# Patient Record
Sex: Female | Born: 1980 | Hispanic: Yes | Marital: Married | Smoking: Never smoker
Health system: Southern US, Community
[De-identification: ages and names within clinical notes are randomized; demographics above are authoritative.]

## PROBLEM LIST (undated history)

## (undated) ENCOUNTER — Inpatient Hospital Stay (HOSPITAL_COMMUNITY): Payer: Self-pay

## (undated) DIAGNOSIS — R51 Headache: Secondary | ICD-10-CM

## (undated) DIAGNOSIS — R519 Headache, unspecified: Secondary | ICD-10-CM

## (undated) HISTORY — PX: APPENDECTOMY: SHX54

---

## 2006-04-30 ENCOUNTER — Ambulatory Visit: Payer: Self-pay | Admitting: Nurse Practitioner

## 2006-05-01 ENCOUNTER — Ambulatory Visit: Payer: Self-pay | Admitting: *Deleted

## 2006-05-19 ENCOUNTER — Ambulatory Visit: Payer: Self-pay | Admitting: Nurse Practitioner

## 2006-12-30 ENCOUNTER — Emergency Department (HOSPITAL_COMMUNITY): Admission: EM | Admit: 2006-12-30 | Discharge: 2006-12-30 | Payer: Self-pay | Admitting: Emergency Medicine

## 2007-05-27 ENCOUNTER — Ambulatory Visit: Payer: Self-pay | Admitting: Family Medicine

## 2008-05-10 ENCOUNTER — Ambulatory Visit: Payer: Self-pay | Admitting: Internal Medicine

## 2008-05-10 ENCOUNTER — Encounter (INDEPENDENT_AMBULATORY_CARE_PROVIDER_SITE_OTHER): Payer: Self-pay | Admitting: Internal Medicine

## 2008-05-10 LAB — CONVERTED CEMR LAB
ALT: 12 units/L (ref 0–35)
AST: 17 units/L (ref 0–37)
Alkaline Phosphatase: 91 units/L (ref 39–117)
Basophils Absolute: 0 10*3/uL (ref 0.0–0.1)
Basophils Relative: 1 % (ref 0–1)
Creatinine, Ser: 0.64 mg/dL (ref 0.40–1.20)
Eosinophils Absolute: 0.1 10*3/uL (ref 0.0–0.7)
MCHC: 33.2 g/dL (ref 30.0–36.0)
MCV: 88.1 fL (ref 78.0–100.0)
Monocytes Relative: 9 % (ref 3–12)
Neutro Abs: 4.5 10*3/uL (ref 1.7–7.7)
Neutrophils Relative %: 66 % (ref 43–77)
Platelets: 271 10*3/uL (ref 150–400)
RBC: 4.55 M/uL (ref 3.87–5.11)
RDW: 12.7 % (ref 11.5–15.5)
Sodium: 137 meq/L (ref 135–145)
TSH: 3.301 microintl units/mL (ref 0.350–4.500)
Total Bilirubin: 0.4 mg/dL (ref 0.3–1.2)
Total Protein: 8.5 g/dL — ABNORMAL HIGH (ref 6.0–8.3)

## 2008-05-12 ENCOUNTER — Ambulatory Visit (HOSPITAL_COMMUNITY): Admission: RE | Admit: 2008-05-12 | Discharge: 2008-05-12 | Payer: Self-pay | Admitting: Internal Medicine

## 2008-06-01 ENCOUNTER — Encounter (INDEPENDENT_AMBULATORY_CARE_PROVIDER_SITE_OTHER): Payer: Self-pay | Admitting: Internal Medicine

## 2008-06-01 ENCOUNTER — Ambulatory Visit: Payer: Self-pay | Admitting: Internal Medicine

## 2008-06-01 LAB — CONVERTED CEMR LAB: Total Protein: 7.7 g/dL (ref 6.0–8.3)

## 2008-08-01 ENCOUNTER — Ambulatory Visit: Payer: Self-pay | Admitting: Internal Medicine

## 2008-09-05 ENCOUNTER — Ambulatory Visit: Payer: Self-pay | Admitting: Internal Medicine

## 2008-10-11 ENCOUNTER — Ambulatory Visit: Payer: Self-pay | Admitting: Internal Medicine

## 2008-10-11 ENCOUNTER — Telehealth (INDEPENDENT_AMBULATORY_CARE_PROVIDER_SITE_OTHER): Payer: Self-pay | Admitting: *Deleted

## 2009-11-14 ENCOUNTER — Ambulatory Visit: Payer: Self-pay | Admitting: Family Medicine

## 2009-11-14 ENCOUNTER — Encounter: Payer: Self-pay | Admitting: Family Medicine

## 2009-11-14 LAB — CONVERTED CEMR LAB
ABO/RH(D): O POS
Antibody Screen: NEGATIVE
Eosinophils Absolute: 0.2 10*3/uL (ref 0.0–0.7)
Eosinophils Relative: 2 % (ref 0–5)
HCT: 35.1 % — ABNORMAL LOW (ref 36.0–46.0)
Hepatitis B Surface Ag: NEGATIVE
Lymphocytes Relative: 25 % (ref 12–46)
Lymphs Abs: 1.9 10*3/uL (ref 0.7–4.0)
MCV: 87.1 fL (ref 78.0–100.0)
Monocytes Relative: 6 % (ref 3–12)
Neutrophils Relative %: 67 % (ref 43–77)
Platelets: 252 10*3/uL (ref 150–400)
RBC: 4.03 M/uL (ref 3.87–5.11)
Rh Type: POSITIVE
WBC: 7.7 10*3/uL (ref 4.0–10.5)

## 2009-11-21 ENCOUNTER — Ambulatory Visit: Payer: Self-pay | Admitting: Family Medicine

## 2009-11-21 ENCOUNTER — Encounter: Payer: Self-pay | Admitting: Family Medicine

## 2009-11-21 DIAGNOSIS — N898 Other specified noninflammatory disorders of vagina: Secondary | ICD-10-CM | POA: Insufficient documentation

## 2009-11-21 LAB — CONVERTED CEMR LAB
GC Probe Amp, Genital: NEGATIVE
Pap Smear: NEGATIVE

## 2009-11-23 ENCOUNTER — Encounter: Payer: Self-pay | Admitting: Family Medicine

## 2009-11-24 ENCOUNTER — Encounter: Payer: Self-pay | Admitting: Family Medicine

## 2009-11-24 ENCOUNTER — Ambulatory Visit (HOSPITAL_COMMUNITY): Admission: RE | Admit: 2009-11-24 | Discharge: 2009-11-24 | Payer: Self-pay | Admitting: Family Medicine

## 2009-12-18 ENCOUNTER — Ambulatory Visit: Payer: Self-pay | Admitting: Family Medicine

## 2009-12-29 ENCOUNTER — Encounter: Payer: Self-pay | Admitting: Family Medicine

## 2010-01-18 ENCOUNTER — Ambulatory Visit: Payer: Self-pay | Admitting: Family Medicine

## 2010-01-19 ENCOUNTER — Ambulatory Visit: Payer: Self-pay | Admitting: Family Medicine

## 2010-01-19 ENCOUNTER — Encounter: Payer: Self-pay | Admitting: Family Medicine

## 2010-02-16 ENCOUNTER — Ambulatory Visit: Payer: Self-pay | Admitting: Family Medicine

## 2010-02-16 ENCOUNTER — Encounter: Payer: Self-pay | Admitting: Family Medicine

## 2010-02-16 LAB — CONVERTED CEMR LAB: Whiff Test: NEGATIVE

## 2010-03-12 ENCOUNTER — Encounter: Payer: Self-pay | Admitting: Family Medicine

## 2010-03-12 ENCOUNTER — Ambulatory Visit: Admission: RE | Admit: 2010-03-12 | Discharge: 2010-03-12 | Payer: Self-pay | Source: Home / Self Care

## 2010-03-12 LAB — CONVERTED CEMR LAB
HIV: NONREACTIVE
Platelets: 232 10*3/uL (ref 150–400)
RBC: 3.67 M/uL — ABNORMAL LOW (ref 3.87–5.11)
WBC: 8.1 10*3/uL (ref 4.0–10.5)

## 2010-03-14 ENCOUNTER — Ambulatory Visit: Admission: RE | Admit: 2010-03-14 | Discharge: 2010-03-14 | Payer: Self-pay | Source: Home / Self Care

## 2010-03-14 ENCOUNTER — Encounter: Payer: Self-pay | Admitting: Family Medicine

## 2010-03-14 DIAGNOSIS — O99810 Abnormal glucose complicating pregnancy: Secondary | ICD-10-CM | POA: Insufficient documentation

## 2010-03-20 NOTE — Assessment & Plan Note (Signed)
Summary: OB/KH   Vital Signs:  Patient profile:   30 year old female Weight:      135 pounds Pulse rate:   72 / minute BP sitting:   100 / 64  (left arm) Cuff size:   regular  Vitals Entered By: Tessie Fass CMA (January 18, 2010 10:14 AM) CC: OB Visit   Primary Care Camilah Spillman:  Lloyd Huger MD  CC:  OB Visit.  History of Present Illness: Visit conducted in [place].  She is G2P1, prior NSVD at term.  Feels daily fetal movement.  No vaginal bleeding or discharge.    Reports that her mother has DM, adult onset.  Patient denies history of GDM with first pregnancy; first baby between 6-7lbs birth weight.  Initial labs and UCx reviewed today with patient.  Dating by initial dating [place] at [facility].    Habits & Providers  Alcohol-Tobacco-Diet     Cigarette Packs/Day: n/a   Impression & Recommendations:  Problem # 1:  PREGNANT STATE, INCIDENTAL (ICD-V22.2) Patient here for routine prenatal visit.  Of note, patient's mother with DM.  Patient meets criteria for early glucola testing (ethnicity, 1st degree relative with DM).  1hrGTT done today failed.  Is to return tomorrow for 3hGTT.  Discussed plans to breast feed.  Patient growth and weight discussed.  If 3hGTT passes, then to follow up here in 4 weeks.  Dating by initial [place] establishing EDD 06/01/2010. Orders: Glucose 1 hr-[facility] (82950) Other OB visit- [facility] (OBCK)  Complete Medication List: 1)  Prenatal Vitamins 0.8 Mg Tabs (Prenatal multivit-min-fe-fa)  Patient Instructions: 1)  Fue un placer verle hoy.  Tiene 20 semanas con 6 dias de embarazo. 2)  Siga tomando las vitaminas antenatales. 3)  Estamos chequeando para diabetes gestacional.   4)  OB followup in 4 weeks with Dr Cristal Ford or other physician in AM slot Mauricio Po is fine)   Orders Added: 1)  Glucose 1 hr-[facility] [[id number]] 2)  Other OB visit- [facility] [OBCK]     Flowsheet View for Follow-up Visit    Estimated weeks of       gestation:     20 6/7    Weight:     135    Blood  pressure:   100 / 64    Hx headache?     No    Nausea/vomiting?   No    Edema?     0    Bleeding?     no    Leakage/discharge?   no    Fetal activity:       yes    Labor symptoms?   no    Fundal height:      umbilicus    FHR:       140    Fetal position:      N/A    Taking Vitamins?   Y    Smoking PPD:   n/a    Comment:     early 1hrGTT    Next visit:     4 wk    Preceptor:     Mauricio Po

## 2010-03-20 NOTE — Assessment & Plan Note (Signed)
Summary: New OB visit   Vital Signs:  Patient profile:   30 year old female LMP:     08/06/2009 Weight:      129.8 pounds BP sitting:   100 / 68  Vitals Entered By: Arlyss Repress CMA, (November 21, 2009 1:51 PM)  Menstrual History Menses Interval (days):  variable Menstrual Flow (days):  4  Primary Provider:  Lloyd Huger MD   History of Present Illness: Patient had IUD taken out in 06/2009 and had menstrual period of regular quality on 08/06/2009. Prior to this, periods were irregular and sometimes occurring once every 2 months. Pregnancy was planned and desired, she is excited about second child.   HAs have been occurring about 2x/day after eating meals. She feels tired and lies down. Denies photophobia, vision changes. These are mild and she has not taken any medications.   Has had nausea for past 2 months that is mild. Only 1x emesis.   Past History:  Past Surgical History: Appendectomy in [place] at age 26  Social History: Occupation:  restaurant  Packs/Day:  n/a  Review of Systems       The patient complains of peripheral edema and headaches.  The patient denies anorexia, fever, vision loss, syncope, dyspnea on exertion, melena, incontinence, genital sores, difficulty walking, and depression.    Physical Exam  General:  Well-developed,well-nourished,in no acute distress; alert,appropriate and cooperative throughout examination Head:  Normocephalic and atraumatic without obvious abnormalities. No apparent alopecia or balding. Eyes:  PERRLA, EOMI Mouth:  Oral mucosa and oropharynx without lesions or exudates.  Teeth in good repair. Lungs:  Normal respiratory effort, chest expands symmetrically. Lungs are clear to auscultation, no crackles or wheezes. Heart:  Normal rate and regular rhythm. S1 and S2 normal without gallop, murmur, click, rub or other extra sounds. Abdomen:  Bowel sounds positive,abdomen soft and non-tender without masses, organomegaly or hernias noted.  Surgical scar on right periumbilicus. Genitalia:  Normal introitus for age, no external lesions,  mucosa pink and moist, no vaginal or cervical lesions, no vaginal atrophy, no friaility or hemorrhage. Moderate amount white discharge.  Msk:  No deformity or scoliosis noted of thoracic or lumbar spine.   Extremities:  No clubbing, cyanosis, edema, or deformity noted with normal full range of motion of all joints.   Neurologic:  No cranial nerve deficits noted. Station and gait are normal. Sensory, motor and coordinative functions appear intact. Skin:  Intact without suspicious lesions or rashes Psych:  Cognition and judgment appear intact. Alert and cooperative with normal attention span and concentration. No apparent delusions, illusions, hallucinations   Impression & Recommendations:  Problem # 1:  PREGNANT STATE, INCIDENTAL (ICD-V22.2) Appears to be progressing without complication. Will obtain dating ultrasound with history of periods at irregular intervals.  Orders: GC/Chlamydia-[facility] (87591/[id number]) Pap Smear-[facility] ([id number]) [facility] Prenatal U/S > 14 weeks - 91478 (Prenatal U/S) Other OB visit- [facility] (OBCK)  Problem # 2:  LEUKORRHEA NOT SPECIFIED AS INFECTIVE (ICD-623.5) Pt did not complain of vaginal discharge; however, discharge was noted on exam and wet mount performed. No trichamonads, yeast, or clue cells detected. Will f/u with GC/Chl tests.   Complete Medication List: 1)  Prenatal Vitamins 0.8 Mg Tabs (Prenatal multivit-min-fe-fa)  Patient Instructions: 1)  Please schedule an OB appointment in one month with Dr. Mauricio Po or Dr. [place]. 2)  You will be contacted about the ultrasound to show the age of the baby.  3)  I will call you if your test results  are not normal. 4)  Continue taking prenatal vitamins. 5)  You may take tylenol (acetaminophen) for headaches if you desire.    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  Menstrual History    LMP (date): 08/06/2009    EDC by LMP: 05/13/2010    Best Working EDC: 05/13/2010    LMP - Character: normal    LMP - Reliable? : Yes    Menses interval: variable days    Menstrual flow 4 days    On BCP's at conception: no    Date of positive (+) home preg. test: 10/13/2009    Pre Pregnancy Weight: 125 lbs.    Symptoms since LMP: amenorrhea, nausea, vomiting    Other symptoms: low back pain, muscular soreness   Flowsheet View for Follow-up Visit    Estimated weeks of       gestation:     15 2/7    Weight:     129.8    Blood pressure:   100 / 68    Headache:     daily    Nausea/vomiting:   nausea    Edema:     TrLE    Vaginal bleeding:   no    Vaginal discharge:   no    FHR:       140    Fetal activity:     yes    Labor symptoms:   no    Taking prenatal vits?   Y    Smoking:     n/a    Next visit:     4 wk  Prenatal Visit    FOB name: Herby Abraham [facility] Confirmation:    New working [facility]: 05/13/2010    LMP reliable? Yes    Last menses onset (LMP) date: 08/06/2009    EDC by LMP: 05/13/2010   Past Pregnancy History    Gravida:     2    Term Births:     1    Premature Births:   0    Living Children:   1    Para:       1    Mult. Births:     0    Prev C-Section:   0    Aborta:     0    Elect. Ab:     0    Spont. Ab:     0  Pregnancy # 1    Delivery date:     01/04/2003    Weeks Gestation:   40    Preterm labor:     no    Delivery type:     NSVD    Infant Sex:     Female    Birth weight:     6lb 13oz.    Name:     [place]   Genetic History    Father of baby:   Herby Abraham    FOB Family Hx:     Unknown     Thalassemia:     mother: no    Neural tube defect:   mother: no    Down's Syndrome:  mother:  no    Tay-Sachs:     mother: no    Sickle Cell Dz/Trait:   mother: no    Hemophilia:     mother: no    Muscular Dystrophy:   mother: no    Cystic Fibrosis:   mother: no    Huntington's Dz:   mother: no    Mental Retardation:   mother: no    Fragile X:     mother: no    Other Genetic or       Chromosomal Dz:   mother: no    Child with other       birth defect:     mother: no    > 3 spont. abortions:   mother: no    Hx of stillbirth:     mother: no   Laboratory Results  Date/Time Received: November 21, 2009 2:45 PM  Date/Time Reported: November 21, 2009 3:47 PM   [employer] Source: vag WBC/hpf: 10-20 Bacteria/hpf: 3+  Rods Clue cells/hpf: none  Negative whiff Yeast/hpf: none Trichomonas/hpf: none Comments: many sperm ...............test performed by......Marland KitchenBonnie A. 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  Menstrual History    LMP (date): 08/06/2009    EDC by LMP: 05/13/2010    Best Working EDC: 05/13/2010    LMP - Character: normal    LMP - Reliable? : Yes    Menses interval: variable days    Menstrual flow 4 days    On BCP's at conception: no    Date of positive (+) home preg. test: 10/13/2009    Pre Pregnancy Weight: 125 lbs.    Symptoms since LMP: amenorrhea, nausea, vomiting    Other symptoms: low back pain, muscular soreness

## 2010-03-20 NOTE — Assessment & Plan Note (Signed)
Summary: ob visit,tcb   Vital Signs:  Patient profile:   30 year old female Weight:      130 pounds Pulse rate:   82 / minute BP sitting:   109 / 66  Vitals Entered By: Garen Grams LPN (December 18, 2009 3:05 PM)  Primary Kathaleya Mcduffee:  Lloyd Huger MD   History of Present Illness: 30 yo hispanic female G2P1 presents at 16.3 weeks by dating ultrasound (this is 3 weeks earlier than previous LMP dating). Uncomplicated thus far. Complaints include mild constipation, low back pain after long periods of standing or walking, and recent cold-like symptoms. She also had HAs which have resolved. Drinking ginger tea and cinnamon tea for cold. Taking tums for heartburn. Not gaining much weight due to decreased appetite. Denies n/v, just can't find food that is appetizing.   Past History:  Past Surgical History: Last updated: 11/21/2009 Appendectomy in [place] at age 30  Risk Factors: Packs/Day: n/a (12/18/2009)  Review of Systems  The patient denies fever, weight loss, weight gain, chest pain, syncope, dyspnea on exertion, peripheral edema, prolonged cough, headaches, and abdominal pain.    Physical Exam  General:  Well-developed,well-nourished,in no acute distress; alert,appropriate and cooperative throughout examination Head:  NCAT Eyes:  PERRLA, EOMI Mouth:  Oral mucosa and oropharynx without lesions or exudates.  Teeth in good repair. Lungs:  Normal respiratory effort, chest expands symmetrically. Lungs are clear to auscultation, no crackles or wheezes. Heart:  Normal rate and regular rhythm. S1 and S2 normal without gallop, murmur, click, rub or other extra sounds. Abdomen:  Gravid. Soft, mild tenderness over pubic tubercle. BS+ Msk:  No deformity or scoliosis noted of thoracic or lumbar spine.   Extremities:  No clubbing, cyanosis, edema, or deformity noted with normal full range of motion of all joints.   Neurologic:  No cranial nerve deficits noted. Station and gait are normal.   Sensory, motor and coordinative functions appear intact. Skin:  Intact without suspicious lesions or rashes Psych:  Cognition and judgment appear intact. Alert and cooperative with normal attention span and concentration. No apparent delusions, illusions, hallucinations   Impression & Recommendations:  Problem # 1:  PREGNANT STATE, INCIDENTAL (ICD-V22.2) Assessment Unchanged Normal progression at 16.3 by dating ultrasound on 10-7, periods were unreliable and would be 19 weeks per LMP. Counseled on weight gain and recommended she try small meals throughout the day even if not hungry. Scheduled anatomy screen at 18 weeks with Dr. Elsie Stain office. Will f/u in OB clinic in 4 weeks.   Orders: Prenatal U/S > 14 weeks - [address] (Prenatal U/S) Other OB visit- [facility] (OBCK)  Complete Medication List: 1)  Prenatal Vitamins 0.8 Mg Tabs (Prenatal multivit-min-fe-fa)  Patient Instructions: 1)  Try to eat frequent small meals throughout the day for proper weight gain. 2)  You may drink tea for your cold symptoms. 3)  You may use miralax (polyethylene glycol) for constipation.  4)  Please schedule OB visit in 4 weeks with Dr. Mauricio Po or Dr. [place].   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  Menstrual History    LMP (date): 08/06/2009    Best Working EDC: 06/01/2010    LMP - Character: normal    Menses interval: variable days    Menstrual flow 4 days    On BCP's at conception: no    Date of positive (+) home preg. test: 10/13/2009   Flowsheet View for Follow-up Visit    Estimated weeks of       gestation:      16 3/7    Weight:     130    Blood pressure:   109 / 66    Headache:     No    Nausea/vomiting:   No    Edema:     0    Vaginal bleeding:   no    Vaginal discharge:   no    FHR:       150    Fetal activity:     yes    Labor symptoms:   no    Fetal position:     N/A    Taking prenatal vits?   Y    Smoking:     n/a    Next visit:     4 wk  Prenatal Visit EDC Confirmation:    New working [facility]: 06/01/2010 Ultrasound Dating Information:    First U/S on 11/24/2009   Gest age: 46.0   EDC: 06/01/2010.   Appended Document: ob visit,tcb ultrasound received from dr. Elsie Stain ofc and placed in dr. Sherran Needs box

## 2010-03-20 NOTE — Letter (Signed)
Summary: Generic Letter  [facility]  [address]   [place], [place] [address]   Phone: [phone]  Fax: [phone]    11/23/2009  Brittany Mejia [address] [place], [place]  [address]  Dear [name],  Your recent lab tests and pap smear were normal. There were no irregular cells detected. It is recommended that you continue to be screened on a yearly basis.   Sincerely,   Lloyd Huger MD  Appended Document: Generic Letter mailed.  Appended Document: Generic Letter mailed.

## 2010-03-22 NOTE — Assessment & Plan Note (Signed)
Summary: OB/KH   KONKOL NOT AVAIL IN AM   Vital Signs:  Patient profile:   30 year old female Weight:      136 pounds Temp:     97.6 degrees F oral Pulse rate:   77 / minute BP sitting:   97 / 60  (left arm) Cuff size:   regular  Vitals Entered By: Loralee Pacas CMA (February 16, 2010 9:36 AM) CC: ob   Primary Care Provider:  Lloyd Huger MD  CC:  ob.  History of Present Illness: 1.  Discharge:  Patient complaining of thin white discharge that has increasd over past 3 days.  Also with burning and itching.  No new sexual contacts.  Denies thick, curd like discharge.  No odor to discharge.  Otherwise see OB FLowsheet and CPOE  2.  Question of contractions:  Patient feels she may have had 1 or 2 contractions over past 2 days.  Feel tightening in her pelvis like menstrual cramps.  Has been more active with walking and family visits/shopping for past several days.  Not drinking much water by report.    Habits & Providers  Alcohol-Tobacco-Diet     Cigarette Packs/Day: n/a  Current Problems (verified): 1)  Leukorrhea Not Specified As Infective  (ICD-623.5) 2)  Pregnant State, Incidental  (ICD-V22.2)  Current Medications (verified): 1)  Prenatal Vitamins 0.8 Mg Tabs (Prenatal Multivit-Min-Fe-Fa)  Allergies (verified): No Known Drug Allergies  Review of Systems       no headaches, vision changes, chest pain, dyspnea, nausea/vomiting, changes in bowel habits, lower extremity edema   Physical Exam  General:  Vital signs reviewed. Well-developed, well-nourished patient in NAD.  Awake and cooperative  Abdomen:  gravid, fundal height and FHTs appropriate for gestational age, bowel sounds present in all four quadrants      Impression & Recommendations:  Problem # 1:  PREGNANT STATE, INCIDENTAL (ICD-V22.2) Assessment Unchanged Patient doing well.  Fundal heights and FHTs appropriate.  Prenatal screen reviewed.  Passed 3 hour glucose. Patient with some questionable  contractions, due to these symptoms and increasd discharge checked ferning slide.   Increased activity and decreasd oral fluid intake for past several days.  These likely the cause of questionable contractions.   Gave strict red flags and reasons to return to clinic orally and written. To fu in 4 weeks for routine labor check.   Orders: Miscellaneous Lab Charge-[facility] (74259) Other OB visit- [facility] (OBCK)  Problem # 2:  LEUKORRHEA NOT SPECIFIED AS INFECTIVE (ICD-623.5) Most likely normal increased discharge with pregnancy.  Ferning negative.  Wet prep negative.  Reassured patient.   [phone])  Complete Medication List: 1)  Prenatal Vitamins 0.8 Mg Tabs (Prenatal multivit-min-fe-fa)  Patient Instructions: 1)  Make an appt to follow up in 4 with with Dr. Cristal Ford, Dr. Mauricio Po, or myself.  2)  If you have any vaginal bleeding, increased fluid, or increasing contractions, call or go to MAU.     Orders Added: 1)  Miscellaneous Lab Charge-[facility] [99999] 2)  Wet Prep- [facility] [87210] 3)  Other OB visit- [facility] [OBCK]      Flowsheet View for Follow-up Visit    Estimated weeks of       gestation:     25 0/7    Weight:     136    Blood pressure:   97 / 60    Headache:     No    Nausea/vomiting:   No    Edema:  0    Vaginal bleeding:   no    Vaginal discharge:   no    Fundal height:      25    FHR:       130s    Fetal activity:     yes    Labor symptoms:   no    Fetal position:     N/A    Taking prenatal vits?   Y    Smoking:     n/a    Next visit:     4 wk    Resident:     Gwendolyn Grant  Laboratory Results  Date/Time Received: February 16, 2010 10:36 AM  Date/Time Reported: February 16, 2010 11:17 AM   Wet Mount Source: vag WBC/hpf: 5-10 Bacteria/hpf: 3+  Rods Clue cells/hpf: none  Negative whiff Yeast/hpf: none Trichomonas/hpf: none  Other Tests  Ferning: absent Comments: sperm present ...............test performed by......Marland KitchenBonnie A. Sw[place]il[place], MLS  (ASCP)cm      Flowsheet View for Follow-up Visit    Estimated weeks of       gestation:     25 0/7    Weight:     136    Blood pressure:   97 / 60    Hx headache?     No    Nausea/vomiting?   No    Edema?     0    Bleeding?     no    Leakage/discharge?   no    Fetal activity:       yes    Labor symptoms?   no    Fundal height:      25    FHR:       130s    Fetal position:      N/A    Taking Vitamins?   Y    Smoking PPD:   n/a    Next visit:     4 wk    Resident:     Gwendolyn Grant

## 2010-03-22 NOTE — Assessment & Plan Note (Signed)
Summary: 1 MOS PER DR Thuan Tippett,PT WILL BE 26WKS/RH   Vital Signs:  Patient profile:   30 year old female Height:      61 inches Weight:      140 pounds Temp:     98.3 degrees F oral Pulse rate:   77 / minute Pulse rhythm:   regular BP sitting:   105 / 60  (right arm) Cuff size:   regular  Vitals Entered By: Loralee Pacas CMA (March 12, 2010 10:05 AM) CC: ob Is Patient Diabetic? No   CC:  ob.  Habits & Providers  Alcohol-Tobacco-Diet     Cigarette Packs/Day: n/a  Exercise-Depression-Behavior     Have you felt down or hopeless? no     Have you felt little pleasure in things? no     Depression Counseling: not indicated; screening negative for depression     Seat Belt Use: always  Current Medications (verified): 1)  Prenatal Vitamins 0.8 Mg Tabs (Prenatal Multivit-Min-Fe-Fa)  Allergies (verified): No Known Drug Allergies  Social History: Risk analyst Use:  always  Review of Systems       no headaches, vision changes, chest pain, dyspnea, nausea/vomiting, changes in bowel habits, lower extremity edema   Physical Exam  General:  Vital signs reviewed. Well-developed, well-nourished patient in NAD.  Awake and cooperative  Abdomen:  gravid, fundal height and FHTs appropriate for gestational age, bowel sounds present in all four quadrants  Extremities:  No clubbing, cyanosis, edema, or deformity noted with normal full range of motion of all joints.  No LE edema noted   Impression & Recommendations:  Problem # 1:  PREGNANT STATE, INCIDENTAL (ICD-V22.2) 28 wk G2P1001 here for FU OB visit.   Patient doing well, without complaints.   Prenatal labs reviewed, passed 3 hour Glucola earlier. 28 week labs obtained today.  Discussed need for repeat Glucola with patient as she is now 28 weeks.  She states she would rather just do the 3 hour test again since she failed the first 1 hour Glucola.  Does not have time to obtain today, will make lab appt for this week.     Patient is  a patient of Dr. Ernest Haber, however Dr. Cristal Ford is currently on inpatient rotation and unavailable for AM clinic times.  Patient prefers AM clinic times due to having young children home from school in PM.   FU in 2 weeks with either myself or Dr. Cristal Ford.  Also discussed making appt in OB clinic today.   Orders: CBC-[facility] ([address]) RPR-[facility] [phone]) HIV-[facility] ([id number]) Other OB visit- [facility] (OBCK)  Complete Medication List: 1)  Prenatal Vitamins 0.8 Mg Tabs (Prenatal multivit-min-fe-fa)  Patient Instructions: 1)  Schedule for a lab appt for Glucola test in the next few days.  2)  Schedule for the next OB clinic.    3)  Come back in 2 weeks to see either me or Dr. Cristal Ford.   4)  We will check your blood today and let you know the results.   5)  If your having any bad pain, vaginal bleeding, not feeling the baby move call the clinic or go to the MAU immediately.   6)  Good to see you again today!   Orders Added: 1)  CBC-[facility] [85027] 2)  RPR-[facility] [[id number]] 3)  HIV-[facility] [[id number]] 4)  Other OB visit- [facility] [OBCK]      Flowsheet View for Follow-up Visit    Estimated weeks of       gestation:  28 3/7    Weight:     140    Blood pressure:   105 / 60    Headache:     No    Nausea/vomiting:   No    Edema:     0    Vaginal bleeding:   no    Vaginal discharge:   no    Fundal height:      28    Fetal activity:     yes    Labor symptoms:   no    Fetal position:     N/A    Taking prenatal vits?   Y    Smoking:     n/a    Next visit:     2 wk    Resident:     Mendel [place] for Follow-up Visit    Estimated weeks of       gestation:     28 3/7    Weight:     140    Blood pressure:   105 / 60    Hx headache?     No    Nausea/vomiting?   No    Edema?     0    Bleeding?     no    Leakage/discharge?   no    Fetal activity:       yes    Labor symptoms?   no    Fundal height:      28    Fetal position:      N/A    Taking Vitamins?   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  Menstrual History    LMP (date): 08/06/2009    LMP - Character: normal    Menses interval: variable days    Menstrual flow 4 days    On BCP's at conception: no    Date of positive (+) home preg. test: 10/13/2009

## 2010-03-22 NOTE — Letter (Signed)
Summary: Handout Printed  Printed Handout:  - Pityriasis Rosea  Appended Document: Handout Printed Printed by accident - this was actually a handout for another patient.   This patient does not have Pityriasis.

## 2010-03-28 ENCOUNTER — Ambulatory Visit (INDEPENDENT_AMBULATORY_CARE_PROVIDER_SITE_OTHER): Payer: Self-pay | Admitting: Family Medicine

## 2010-03-28 ENCOUNTER — Encounter: Payer: Self-pay | Admitting: Family Medicine

## 2010-03-28 VITALS — BP 98/60 | Wt 142.0 lb

## 2010-03-28 DIAGNOSIS — Z3480 Encounter for supervision of other normal pregnancy, unspecified trimester: Secondary | ICD-10-CM

## 2010-03-28 DIAGNOSIS — Z3490 Encounter for supervision of normal pregnancy, unspecified, unspecified trimester: Secondary | ICD-10-CM | POA: Insufficient documentation

## 2010-03-28 NOTE — Patient Instructions (Addendum)
Psychiatrist Glass blower/designer trimestre) (Pregnancy - Third Trimester)   El tercer trimestre del embarazo (los ltimos 3 meses) es el perodo de cambios ms rpidos que atraviesan usted y el beb. El aumento de peso es ms rpido. El beb alcanza un largo de aproximadamente 50 cm (20 pulgadas) y pesa entre 2,700 y 4,500 kg (6 a 10 libras). El beb gana ms tejido graso y ya est listo para la vida fuera del cuerpo de la [place]. Mientras estn en el interior, los bebs tienen perodos de sueo y vigilia, Warehouse manager y tienen hipo. Quizs sienta pequeas contracciones del tero. Este es el falso trabajo de [place]. Tambin se las conoce como contracciones de [place]. Es como una prctica del parto. Los problemas ms habituales de esta etapa del embarazo incluyen mayor dificultad para respirar, hinchazn de las manos y los pies por retencin de lquidos y la necesidad de Geographical information systems officer con ms frecuencia debido a que el tero y el beb presionan sobre la vejiga.    EXAMENES PRENATALES  Durante los [employer]power [employer], deber seguir realizando pruebas de [place], segn avance el [place]. Estas pruebas se realizan para controlar su salud y la del beb. Tambin se realizan anlisis de sangre para [employer] niveles de [place]. La anemia (bajo nivel de hemoglobina) es frecuente durante el embarazo. Para prevenirla, se administran hierro y vitaminas. Tambin le harn nuevas pruebas para descartar la diabetes. Podrn repetirle algunas de las [employer] hicieron previamente.  En cada visita le medirn el tamao del tero. Es para asegurarse de que el beb se desarrolla correctamente.   Tambin en cada visita la pesarn. Esto se realiza para asegurarse de que aumenta de peso al ritmo indicado y que usted y su beb evolucionan normalmente.  En algunas ocasiones se realiza una ecografa para confirmar el correcto desarrollo y evolucin del beb. Esta prueba se realiza con ondas sonoras inofensivas para el beb, de  modo que el profesional pueda calcular con ms precisin la fecha del [place].  Discuta las posibilidades de la anestesia si necesita cesrea.   Algunas veces se realizan pruebas especializadas del lquido amnitico que rodea al beb. Esta prueba se denomina amniocentesis. El lquido amnitico se obtiene introduciendo una aguja en el abdomen (vientre). En ocasiones se lleva a cabo cerca del final del embarazo, si es Optician, dispensing. En este caso se realiza para asegurarse de que los pulmones del beb estn lo suficientemente maduros como para que pueda vivir fuera del tero.   CAMBIOS QUE OCURREN EN EL TERCER TRIMESTRE DEL EMBARAZO Su organismo atravesar diferentes cambios durante el embarazo que varan de Neomia Dear persona a Educational psychologist. Converse con el profesional que la asiste acerca los cambios que usted note y que la preocupen.  Durante el ltimo trimestre probablemente sienta un aumento del apetito. Es normal tener "antojos" de Development worker, community. Esto vara de Neomia Dear persona a otra y de un embarazo a Therapist, art.   Podrn aparecer las primeras estras en las caderas, abdomen y [place]. Estos son cambios normales del cuerpo durante el [place]. No existen medicamentos ni ejercicios que puedan prevenir CarMax.  El estreimiento puede tratarse con un laxante o agregando fibra a su dieta. Beber grandes cantidades de lquidos, tomar fibras en forma de verduras, frutas y granos integrales es de [place].  Tambin es beneficioso practicar actividad fsica. Si ha sido una persona Engineer, mining, podr continuar con la [employer]son de las actividades durante el mismo. Si ha sido [employer], puede ser  beneficioso que comience con un programa de ejercicios, como Scientist, clinical (histocompatibility and immunogenetics). Consulte con el profesional que la asiste antes de comenzar un programa de ejercicios.  Evite el consumo de cigarrillos, el alcohol, los medicamentos no prescritos y las "drogas de la calle" durante el [place]. Estas  sustancias qumicas afectan la formacin y el desarrollo del beb. Evite estas sustancias durante todo el embarazo para asegurar el nacimiento de un beb sano.  Dolor de espalda, venas varicosas y hemorroides podran aparecer o empeorar.  Los movimientos del beb pueden ser ms bruscos y aparecer ms a menudo.  Puede que note dificultades para respirar facilmente.  El ombligo podra salrsele hacia afuera.  Puede segregar un lquido amarillento (calostro) de las Standard.  Puede segregar  mucus con sangre. Esto normalmente ocurre unos [address] a una semana antes de que comience el [place].     INSTRUCCIONES PARA EL CUIDADO DOMICILIARIO  La mayor parte de los cuidados que se aconsejan son los mismos que los indicados para las primeras etapas del Psychiatrist. Es importante que concurra a todas las citas con el profesional y siga sus instrucciones con Camera operator a los medicamentos que deba Chemical engineer, a la actividad fsica y a Psychologist, forensic.  Durante el embarazo debe obtener nutrientes para usted y para su beb. Consuma alimentos balanceados a intervalos regulares. Elija alimentos como carne, pescado, [place] y otros productos lcteos descremados, verduras, frutas, panes integrales y cereales. El Equities trader cul es el aumento de peso ideal.  Las relaciones sexuales pueden continuarse hasta casi el final del embarazo, si no se presentan otros problemas como prdida prematura (antes de tiempo) de lquido amnitico, hemorragia vaginal o dolor abdominal (en el vientre).  Realice Tesoro Corporation, si no tiene restricciones. Consulte con el profesional que la asiste si no sabe con certeza si determinados ejercicios son seguros. El mayor aumento de peso se produce [employer]ocker ltimos trimestres del [place].  Haga reposo con frecuencia, con las piernas elevadas, o segn lo necesite para evitar los calambres y el dolor de cintura.  Use un buen sostn o como los que se usan para  hacer deportes para Paramedic la sensibilidad de las [place]. Tambin puede serle til si lo [place] mientras duerme. Si pierde Product manager, podr [employer]in.  No utilice la baera con agua caliente, baos turcos y saunas.  Colquese el cinturn de seguridad cuando conduzca. Este la proteger a usted y al beb en caso de accidente.  Evite comer carne cruda y el contacto con los utensilios y desperdicios de los gatos. Estos elementos contienen grmenes que pueden causar defectos de nacimiento en el beb.  Es fcil perder algo de orina durante el [place]. Apretar y Chief Operating Officer los msculos de la pelvis la ayudar con este problema. Practique detener la miccin cuando est en el bao. Estos son los mismos msculos que Development worker, international aid. Son [employer] mismos msculos que utiliza cuando trata de Ryder System gases. Puede practicar apretando estos msculos WellPoint, y repetir esto tres veces por da aproximadamente. Una vez que conozca qu msculos debe contraer, no realice estos ejercicios durante la miccin. Puede favorecerle una infeccin si la orina vuelve hacia atrs.  Pida ayuda si tiene necesidades econmicas, de asesoramiento o nutricionales durante el [place]. El profesional podr ayudarla con respecto a estas necesidades, o derivarla a otros especialistas.  Practique la ida [employer] hospital a modo de [place].  Tome clases prenatales junto con su pareja para comprender, practicar  y hacer preguntas acerca del Aleen Campi de parto y el nacimiento.  Prepare la habitacin del beb.  No viaje fuera de la ciudad a menos que sea absolutamente necesario y con el consejo del mdico.  Use slo zapatos bajos sin taco para tener un mejor equilibrio y prevenir cadas.   EL CONSUMO DE MEDICAMENTOS Y DROGAS DURANTE EL EMBARAZO  Contine tomando las vitaminas apropiadas para esta etapa tal como se le indic. Las vitaminas deben contener un miligramo de cido flico y deben suplementarse con  hierro. Guarde todas las vitaminas fuera del alcance de los nios. La ingestin de slo un par de vitaminas o comprimidos que contengan hierro pueden ocasionar la [employer] Mining en un beb o en un nio pequeo.  Evite el uso de [place], inclusive los de venta [place], que no hayan sido prescritos o indicados por el profesional que la asiste. Algunos medicamentos pueden causar problemas fsicos al beb. Utilice los medicamentos de venta libre o de prescripcin para Chief Technology Officer, Environmental health practitioner o la [place], segn se lo indique el profesional que lo asiste. No utilice aspirina, ibuprofeno (Motrin, Advil, Nuprin) o naproxeno (Aleve) a menos que el profesional la autorice.  El alcohol se asocia a cierto nmero de defectos del nacimiento, incluido el sndrome de alcoholismo fetal. Debe evitar el consumo de alcohol en cualquiera de sus formas. El cigarrillo causa nacimientos prematuros y bebs de bajo peso al nacer. Las drogas de la calle son muy nocivas para el beb y estn absolutamente prohibidas. Un beb que nace de [employer] Express, ser adicto al nacer. Ese beb tendr los mismos sntomas de abstinencia que un adulto.  Infrmele al profesional si consume alguna droga.   SOLICITE ATENCIN MDICA SI: Tiene alguna preocupacin Academic librarian. Es mejor que llame para formular las preguntas si no puede esperar hasta la prxima visita, que sentirse preocupada por ellas.    DECISIONES ACERCA DE LA CIRCUNCISIN Usted puede saber o no cul es el sexo de su beb. Si es un varn, ste es el momento de pensar acerca de la circuncisin. La circuncisin es la extirpacin del prepucio. Esta es la piel que cubre el extremo sensible del pene. No hay un motivo mdico que lo justifique. Generalmente la decisin se toma segn lo que sea popular en ese momento, o se basa en creencias religiosas. Podr conversar estos temas con el profesional que la asiste.   SOLICITE ATENCIN MDICA DE INMEDIATO SI:  La temperatura oral se  eleva sin motivo por encima de 100 F (37.8 C) o segn le indique el profesional que la asiste.   Tiene una prdida de lquido por la vagina (canal de parto). Si sospecha una ruptura de las [place], tmese la temperatura y llame al profesional para informarlo sobre esto.  Observa unas pequeas manchas, una hemorragia vaginal o elimina cogulos. Avsele al profesional acerca de la cantidad y de cuntos apsitos est utilizando.  Presenta un olor desagradable en la secrecin vaginal y observa un cambio en el color, de transparente a blanco.  Ha vomitado durante ms de 24 horas.  Presenta escalofros o fiebre.  Comienza a sentir falta de aire.  Siente ardor al Beatrix Shipper.   [place] o sube ms de 900 g (ms de 2 libras), o segn lo indicado por el profesional que la asiste. Observa que sbitamente se le hinchan el rostro, las manos, los pies o las piernas.  Presenta dolor abdominal. Las molestias en el ligamento redondo son Neomia Dear causa benigna (no cancerosa) frecuente de Engineer, mining  abdominal durante el embarazo, pero el profesional que la asiste deber evaluarlo.  Presenta dolor de cabeza intenso que no se [place].  Si no siente los movimientos del beb durante ms de tres horas. Si piensa que el beb no se mueve tanto como lo haca habitualmente, coma algo que Psychologist, clinical y Target Corporation lado izquierdo durante [place]. El beb debe moverse al menos 4  5 veces por hora. Comunquese inmediatamente si el beb se mueve menos que lo indicado.  Se cae, se ve involucrada en un accidente automovilstico o sufre algn tipo de traumatismo.  En su hogar hay violencia mental o fsica.   Document Released: 11/14/2004  Document Re-Released: 12/01/2008 [facility] Patient Information 2011 [place], [place].

## 2010-03-28 NOTE — Assessment & Plan Note (Deleted)
30 yo G2P1001 at 30.5 weeks today based on 2nd trimester [place].   Patient doing well.  No complaints or concerns.  No red flags based on exam or history.   To FU at [facility] next week.

## 2010-03-29 NOTE — Progress Notes (Addendum)
Subjective:    Brittany Mejia is a 30 y.o. female being seen today for her obstetrical visit. She is at [redacted]w[redacted]d gestation. Patient reports no complaints. Fetal movement: normal.  Objective:    BP 98/60  Wt 142 lb (64.411 kg)  LMP 08/06/2009  Physical Exam  Exam Physical Exam:   Gen:  NAD, appears stated age. Abd:  Gravid.  Vertical incision scar noted RLQ.  BS present.  Nontender.  Fundal heights and FHTs appropriate. Ext:  No LE edema.    FHT:  125 BPM  Uterine Size: size equals dates  Presentation: unsure     Assessment:    Pregnancy:  G2P1001    Plan:    Patient Active Problem List  Diagnoses  . Pregnancy, supervision of normal  . ABNORMAL MATERNAL GLUCOSE TOLERANCE ANTEPARTUM    Prenatal counseling discussed.  30 yo G2P1001 at 30.5 weeks today based on 2nd trimester [place].   Patient doing well.  No complaints or concerns.  No red flags based on exam or history.   To FU at [facility] next week.

## 2010-04-05 ENCOUNTER — Ambulatory Visit (INDEPENDENT_AMBULATORY_CARE_PROVIDER_SITE_OTHER): Payer: Self-pay | Admitting: Family Medicine

## 2010-04-05 DIAGNOSIS — Z3480 Encounter for supervision of other normal pregnancy, unspecified trimester: Secondary | ICD-10-CM

## 2010-04-05 NOTE — Patient Instructions (Signed)
Fue un Research officer, trade union.   Tiene 31 semanas con 6 dias de Psychiatrist, determinado por el ultrasonido del 07 [place] en [facility].  Por favor haga una cita con la [place]. [place] o el Dr Gwendolyn Grant en 2 semanas.

## 2010-04-05 NOTE — Progress Notes (Signed)
Subjective:    Brittany Mejia is a 30 y.o. female being seen today for her obstetrical visit. She is at [redacted]w[redacted]d gestation. Patient reports no complaints. Fetal movement: normal.  Visit conducted in [place].  Objective:    BP 101/68  Wt 141 lb 9 oz (64.212 kg)  LMP 08/06/2009  Physical Exam  Exam  FHT:  140 BPM  Uterine Size: 31.5 cm  Presentation: cephalic     Assessment:    Pregnancy:  G2P1001    Plan:    Patient Active Problem List  Diagnoses  . Pregnancy, supervision of normal  . ABNORMAL MATERNAL GLUCOSE TOLERANCE ANTEPARTUM    G2P1 prior NSVD, dating by 13 week [place].  Passed most recent repeat 3hGTT, discussed results with her.  For for repeat OB visit in 2 weeks.  To confirm vertex presentation then. Counseled on kick counts, signs/sx of preterm labor. Follow up in 2 Weeks.

## 2010-04-05 NOTE — Assessment & Plan Note (Signed)
Summary: OB  See Epic note.        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  Menstrual History    LMP (date): 08/06/2009    LMP - Character: normal    Menses interval: variable days    Menstrual flow 4 days    On BCP's at conception: no    Date of positive (+) home preg. test: 10/13/2009      Allergies: No Known Drug Allergies

## 2010-04-06 ENCOUNTER — Encounter: Payer: Self-pay | Admitting: *Deleted

## 2010-04-18 ENCOUNTER — Ambulatory Visit (INDEPENDENT_AMBULATORY_CARE_PROVIDER_SITE_OTHER): Payer: Self-pay | Admitting: Family Medicine

## 2010-04-18 DIAGNOSIS — Z3480 Encounter for supervision of other normal pregnancy, unspecified trimester: Secondary | ICD-10-CM

## 2010-04-18 NOTE — Patient Instructions (Addendum)
Make an appt today to be seen in 2 weeks.   If you have any vaginal bleeding, baby's not moving, or contractions every 10-20 minutes for an hour, call or go to MAU.

## 2010-04-18 NOTE — Progress Notes (Signed)
G2P1001 at 33.5 weeks today by 13 week [place].  No concerns today per patient.   Reviewed prenatal labs, no concerns. No red flags on exam or history. Warnings given verbally and in print.   Discussed 36 week visit and what to expect then:  Pelvic exam, bedside US possibly, GBS swab.    Name: Brittany Mejia Breast feeding Circumcision undecided

## 2010-05-01 LAB — GLUCOSE, CAPILLARY
Glucose-Capillary: 148 mg/dL — ABNORMAL HIGH (ref 70–99)
Glucose-Capillary: 76 mg/dL (ref 70–99)

## 2010-05-03 ENCOUNTER — Other Ambulatory Visit: Payer: Self-pay | Admitting: Family Medicine

## 2010-05-03 ENCOUNTER — Ambulatory Visit (INDEPENDENT_AMBULATORY_CARE_PROVIDER_SITE_OTHER): Payer: Self-pay | Admitting: Family Medicine

## 2010-05-03 VITALS — BP 99/60 | Wt 145.0 lb

## 2010-05-03 DIAGNOSIS — Z331 Pregnant state, incidental: Secondary | ICD-10-CM

## 2010-05-03 DIAGNOSIS — O99810 Abnormal glucose complicating pregnancy: Secondary | ICD-10-CM

## 2010-05-03 DIAGNOSIS — Z3480 Encounter for supervision of other normal pregnancy, unspecified trimester: Secondary | ICD-10-CM

## 2010-05-03 NOTE — Assessment & Plan Note (Signed)
Not sure where to put Glucose results in Epic.   Copied and pasted, placed in Prenatal Vitals and Notes section. Passed 3 hour Glucola, no concerns.

## 2010-05-03 NOTE — Progress Notes (Signed)
G2P1001 at 35.6 weeks today by 13 week [place].  No concerns per patient, doing well. Reviewed prenatal labs, no concerns. No red flags by exam or history.  Warnings given verbally and in print.    Tests: (1) Glucose Tolerance, 3 Hour (Gest) [phone]) ! Glucose, Fasting (Gest)                             74 mg/dL                    60-454     Patient identifiers, specimen collection times and test results have     been confirmed. ! Glucose,1 Hour (Gest)                             133 mg/dL                   09-811 ! Glucose,2 Hour (Gest)                             140 mg/dL                   91-478 ! Glucose,3 Hour (Gest)                             76 mg/dL                    29-562  GBS obtained today along with GC/Chlamydia.   GYN:  External genitalia within normal limits.  Vaginal mucosa pink, moist, normal rugae.  Nonfriable cervix without lesions, no discharge or bleeding noted on speculum exam.  Bimanual exam revealed gravid uterus consistent with dates.  No cervical motion tenderness. No adnexal masses bilaterally.   Bedside US showed cephalic presentation.  Discussed lack of weight gain today.  Baby looks good, good FHTs, size consistent with dates.  Mom very active, working at home, cooking, grocery shopping.  Eating 3 meals a day with some snacks.  No plans to change anything at this time, patient has had good weight gain overall.  Will continue to follow.   Name:  Brittany Mejia Breast feeding Circumcision Undecided Contraception Undecided

## 2010-05-03 NOTE — Patient Instructions (Addendum)
Make an appt to be seen in 1 week.  It will be 1 week from now on. If you have any vaginal bleeding, bad abdominal pain, headaches not relieved with medicine, contractions, or not feeling the baby move, call or go to the MAU.   It was good to see you today.    Kick Count Fetal Movement Counts  Kick counts is highly recommended in high risk pregnancies, but it is a good idea for every pregnant woman to do. Fetal movements increase after eating a full meal or eating or drinking something sweet (the blood sugar is higher). It is also important to drink plenty of fluids (well hydrated) before doing the count. Lie on your left side because it helps with the circulation or you can sit in a comfortable chair with your arms over your belly (abdomen) with no distractions around you. DOING THE COUNT:  Try to do the count the same time of day each time you do it.   Mark the day and time, then see how long it takes for you to feel 10 movements (kicks, flutters, swishes, rolls). You should have at least 10 movements within 2 hours. You will most likely feel 10 movements in much less than 2 hours. If you do not, wait an hour and count again. After a couple of days you will see a pattern.   What you are looking for is a change in the pattern or not enough counts in 2 hours. Is it taking longer in time to reach 10 movements?  SEEK MEDICAL CARE IF:  You feel less than 10 counts in 2 hours. Tried twice.   No movement in one hour.   The pattern is changing or taking longer each day to reach 10 counts in 2 hours.   You feel the baby is not moving as it usually does.

## 2010-05-10 ENCOUNTER — Ambulatory Visit: Payer: Self-pay | Admitting: Family Medicine

## 2010-05-10 NOTE — Progress Notes (Signed)
Addended by: [place], Lorinda Copland on: 05/10/2010 04:13 PM   Modules accepted: Level of Service

## 2010-05-12 NOTE — Progress Notes (Signed)
G2P1001 at 35.6 weeks today by 13 week [place].  No concerns per patient, doing well. Reviewed prenatal labs, no concerns. No red flags by exam or history.  Warnings given verbally and in print.   FU next week  Weight up 2 lbs today.  Patient remains very active, still eating 3 meals a day with small snacks in between, states she feels full more quickly.    Name:  Brittany Mejia Breast feeding Circumcision Undecided Contraception Undecided

## 2010-05-17 ENCOUNTER — Ambulatory Visit (INDEPENDENT_AMBULATORY_CARE_PROVIDER_SITE_OTHER): Payer: Self-pay | Admitting: Family Medicine

## 2010-05-17 ENCOUNTER — Ambulatory Visit: Payer: Self-pay | Admitting: Family Medicine

## 2010-05-17 DIAGNOSIS — Z3480 Encounter for supervision of other normal pregnancy, unspecified trimester: Secondary | ICD-10-CM

## 2010-05-18 NOTE — Progress Notes (Signed)
G2P1001 at 36.6 weeks today by 13 week [place].  No concerns per patient, doing well. Reviewed prenatal labs, no concerns. No red flags by exam or history.  Warnings given verbally and in print.   FU next week  Weight same as last week.  Patient remains very active, still eating 3 meals a day with small snacks in between, states she feels full more quickly.  Trying to drink 8 cups H2O daily.   Fundal heights continuing to grow, FHTs present, no concerns based on exam.  Continue to follow weights.      Name:  Maureen Ralphs Breast feeding Circumcision Undecided Contraception Undecided

## 2010-05-24 ENCOUNTER — Ambulatory Visit (INDEPENDENT_AMBULATORY_CARE_PROVIDER_SITE_OTHER): Payer: Self-pay | Admitting: Family Medicine

## 2010-05-24 DIAGNOSIS — Z3480 Encounter for supervision of other normal pregnancy, unspecified trimester: Secondary | ICD-10-CM

## 2010-05-24 NOTE — Progress Notes (Signed)
G2P1001 at 38.6 weeks today by 13 week [place].  No concerns per patient, doing well. Reviewed prenatal labs, no concerns. No red flags by exam or history.  Warnings given verbally and in print.   FU next week  Weight declined by few ounces since last week.  Patient remains very active, still eating 3 meals a day with small snacks in between, states she feels full more quickly.  Trying to drink 8 cups H2O daily.   Fundal heights continuing to grow, FHTs present, no concerns based on exam.  Continue to follow weights.      Name:  Brittany Mejia Breast feeding Circumcision Undecided Contraception Undecided

## 2010-05-28 ENCOUNTER — Telehealth (HOSPITAL_COMMUNITY): Payer: Self-pay | Admitting: Family Medicine

## 2010-05-28 ENCOUNTER — Inpatient Hospital Stay (HOSPITAL_COMMUNITY)
Admission: AD | Admit: 2010-05-28 | Discharge: 2010-05-28 | Disposition: A | Discharge status: Home / Self Care | Payer: Self-pay | Source: Ambulatory Visit | Attending: Family Medicine | Admitting: Family Medicine

## 2010-05-28 DIAGNOSIS — O479 False labor, unspecified: Secondary | ICD-10-CM | POA: Insufficient documentation

## 2010-05-28 NOTE — Telephone Encounter (Signed)
Pt called and stated having contractions every ten min. No change in discharge, needs to go restroom for elimination but nothing happen. Triage Nurse send pt to MIU.  Marines Foraker

## 2010-05-30 ENCOUNTER — Ambulatory Visit (INDEPENDENT_AMBULATORY_CARE_PROVIDER_SITE_OTHER): Payer: Self-pay | Admitting: Family Medicine

## 2010-05-30 DIAGNOSIS — Z3480 Encounter for supervision of other normal pregnancy, unspecified trimester: Secondary | ICD-10-CM

## 2010-05-30 NOTE — Patient Instructions (Signed)
If you make it that long, we would schedule your induction for Friday April 20 or Monday April 23.  I will discuss with Dr. Cristal Ford. Otherwise, if you have any bleeding, feeling contractions for 10 minutes for 1-2 hours, or feel your water has broken go to MAU.

## 2010-06-01 NOTE — Progress Notes (Signed)
G2P1001 at 39.6 weeks today by 13 week [place].  No concerns per patient, doing well. Reviewed prenatal labs, no concerns. No red flags by exam or history.  Irregular contractions. Warnings given verbally and in print.   FU with me in clinic on Monday.  If still pregnant, will schedule for induction and biweekly NSTs/BPPs  Name:  Brittany Mejia Breast feeding Circumcision Undecided Contraception Undecided

## 2010-06-04 ENCOUNTER — Ambulatory Visit (INDEPENDENT_AMBULATORY_CARE_PROVIDER_SITE_OTHER): Payer: Self-pay | Admitting: Family Medicine

## 2010-06-04 DIAGNOSIS — Z3480 Encounter for supervision of other normal pregnancy, unspecified trimester: Secondary | ICD-10-CM

## 2010-06-05 ENCOUNTER — Other Ambulatory Visit: Payer: Self-pay

## 2010-06-05 DIAGNOSIS — O480 Post-term pregnancy: Secondary | ICD-10-CM

## 2010-06-05 NOTE — Progress Notes (Signed)
G2P1001 at 40.3 weeks today by 13 week [place].  No concerns per patient, doing well.  Ready for baby to be born! Reviewed prenatal labs, no concerns. No red flags by exam or history.  Irregular contractions. Warnings given verbally and in print.  NST/BPP x 2 scheduled for this week.  Induction scheduled for next Monday.  Patient's cervix very soft, doubtful that she will make it to induction.   Will forward to PCP   Name:  Brittany Mejia Breast feeding Circumcision Undecided Contraception Undecided

## 2010-06-05 NOTE — Patient Instructions (Signed)
Again, any concerns such as bleeding, increased discharge, broken water, call MAU.   We have scheduled you for your tests at [facility] and induction next Monday!  Good luck

## 2010-06-06 ENCOUNTER — Telehealth (HOSPITAL_COMMUNITY): Payer: Self-pay | Admitting: Family Medicine

## 2010-06-06 ENCOUNTER — Inpatient Hospital Stay (HOSPITAL_COMMUNITY)
Admission: AD | Admit: 2010-06-06 | Discharge: 2010-06-06 | Disposition: A | Discharge status: Home / Self Care | Payer: Self-pay | Source: Ambulatory Visit | Attending: Obstetrics & Gynecology | Admitting: Obstetrics & Gynecology

## 2010-06-06 DIAGNOSIS — O479 False labor, unspecified: Secondary | ICD-10-CM | POA: Insufficient documentation

## 2010-06-06 NOTE — Telephone Encounter (Signed)
Pt called and stated has some red discharge this morning, pt continued with orange discharge during the day , pt stated that  is not bleeding or has any other symptoms.  Triage nurse encourage pt to go to MIU.

## 2010-06-08 ENCOUNTER — Inpatient Hospital Stay (HOSPITAL_COMMUNITY)
Admission: AD | Admit: 2010-06-08 | Discharge: 2010-06-10 | DRG: 775 | Disposition: A | Discharge status: Home / Self Care | Payer: Medicaid Other | Source: Ambulatory Visit | Attending: Obstetrics & Gynecology | Admitting: Obstetrics & Gynecology

## 2010-06-08 ENCOUNTER — Other Ambulatory Visit: Payer: Self-pay

## 2010-06-08 LAB — CBC
HCT: 38 % (ref 36.0–46.0)
Hemoglobin: 13.2 g/dL (ref 12.0–15.0)
MCV: 89 fL (ref 78.0–100.0)
Platelets: 209 10*3/uL (ref 150–400)
RBC: 4.27 MIL/uL (ref 3.87–5.11)
WBC: 10.5 10*3/uL (ref 4.0–10.5)

## 2010-06-09 LAB — CBC
HCT: 32.2 % — ABNORMAL LOW (ref 36.0–46.0)
Hemoglobin: 10.8 g/dL — ABNORMAL LOW (ref 12.0–15.0)
MCH: 30.1 pg (ref 26.0–34.0)
MCV: 89.7 fL (ref 78.0–100.0)
RBC: 3.59 MIL/uL — ABNORMAL LOW (ref 3.87–5.11)

## 2010-06-12 ENCOUNTER — Inpatient Hospital Stay (HOSPITAL_COMMUNITY)
Admission: AD | Admit: 2010-06-12 | Discharge: 2010-06-12 | Disposition: A | Discharge status: Home / Self Care | Payer: Self-pay | Source: Ambulatory Visit | Attending: Obstetrics & Gynecology | Admitting: Obstetrics & Gynecology

## 2010-06-12 ENCOUNTER — Ambulatory Visit: Payer: Self-pay

## 2010-06-12 DIAGNOSIS — N949 Unspecified condition associated with female genital organs and menstrual cycle: Secondary | ICD-10-CM | POA: Insufficient documentation

## 2010-06-12 DIAGNOSIS — O901 Disruption of perineal obstetric wound: Secondary | ICD-10-CM | POA: Insufficient documentation

## 2010-06-18 ENCOUNTER — Ambulatory Visit (INDEPENDENT_AMBULATORY_CARE_PROVIDER_SITE_OTHER): Payer: Self-pay | Admitting: *Deleted

## 2010-06-18 ENCOUNTER — Ambulatory Visit: Payer: Self-pay

## 2010-06-18 ENCOUNTER — Other Ambulatory Visit: Payer: Self-pay | Admitting: Family Medicine

## 2010-06-18 DIAGNOSIS — Z309 Encounter for contraceptive management, unspecified: Secondary | ICD-10-CM

## 2010-06-18 LAB — POCT URINE PREGNANCY: Preg Test, Ur: NEGATIVE

## 2010-06-18 MED ORDER — MEDROXYPROGESTERONE ACETATE 150 MG/ML IM SUSP: 150.0000 mg | INTRAMUSCULAR | Status: DC

## 2010-06-18 MED ORDER — MEDROXYPROGESTERONE ACETATE 150 MG/ML IM SUSP
150.0000 mg | Freq: Once | INTRAMUSCULAR | Status: AC
  Administered 2010-06-18: 150 mg via INTRAMUSCULAR

## 2010-06-18 MED ORDER — MEDROXYPROGESTERONE ACETATE 150 MG/ML IM SUSP
150.0000 mg | Freq: Once | INTRAMUSCULAR | Status: DC
Start: 2010-06-18 — End: 2010-06-18

## 2010-12-04 ENCOUNTER — Encounter (HOSPITAL_COMMUNITY): Payer: Self-pay | Admitting: *Deleted

## 2012-04-08 ENCOUNTER — Encounter (HOSPITAL_COMMUNITY): Payer: Self-pay | Admitting: *Deleted

## 2012-04-08 ENCOUNTER — Inpatient Hospital Stay (HOSPITAL_COMMUNITY)
Admission: AD | Admit: 2012-04-08 | Discharge: 2012-04-08 | Disposition: A | Discharge status: Home / Self Care | Payer: Self-pay | Source: Ambulatory Visit | Attending: Obstetrics & Gynecology | Admitting: Obstetrics & Gynecology

## 2012-04-08 DIAGNOSIS — Z30431 Encounter for routine checking of intrauterine contraceptive device: Secondary | ICD-10-CM | POA: Insufficient documentation

## 2012-04-08 DIAGNOSIS — Z975 Presence of (intrauterine) contraceptive device: Secondary | ICD-10-CM

## 2012-04-08 LAB — POCT PREGNANCY, URINE: Preg Test, Ur: NEGATIVE

## 2012-04-08 NOTE — MAU Note (Signed)
Patientt states she had a baby in 2012. Had a Mirena placed at her postpartum visit. States she has only felt the string one time. Had two negative pregnancy test at home but thinks she might be pregnant. Denies pain and bleeding at this time.

## 2012-04-08 NOTE — MAU Provider Note (Signed)
History     CSN: [id number]  Arrival date and time: 04/08/12 1406   First Provider Initiated Contact with Patient 04/08/12 1602      No chief complaint on file.  HPI Ms. Brittany Mejia is a 32 y.o. G2P2001 who presents to MAU today because she is unable to feel her IUD strings. The patient states that she had the IUD placed in may or June of 2012. She was able to feel the strings then, but hasn't checked regularly since then. She tried to feel them this week and couldn't. She denies abnormal bleeding. She is having mild lower abdominal cramping. This is not different from normal for her. She states that she has felt "movement" in her lower abdomen.   OB History   Grav Para Term Preterm Abortions TAB SAB Ect Mult Living   2 2 2  0 0     1      History reviewed. No pertinent past medical history.  Past Surgical History  Procedure Laterality Date  . Appendectomy      Performed in [place] at age 36    History reviewed. No pertinent family history.  History  Substance Use Topics  . Smoking status: Never Smoker   . Smokeless tobacco: Not on file  . Alcohol Use: No    Allergies: No Known Allergies  Prescriptions prior to admission  Medication Sig Dispense Refill  . medroxyPROGESTERone (DEPO-PROVERA) 150 MG/ML injection Inject 1 mL (150 mg total) into the muscle every 3 (three) months.  1 mL    . Prenatal Vit-Fe Psac Cmplx-FA (PRENATAL MULTIVITAMIN) 60-1 MG tablet Take 1 tablet by mouth daily with breakfast.          Review of Systems  Constitutional: Negative for fever.  Gastrointestinal: Positive for abdominal pain.  Genitourinary:       Neg - vaginal bleeding   Physical Exam   Blood pressure 119/72, pulse 71, temperature 98.6 F (37 C), temperature source Oral, resp. rate 16, height 5\' 1"  (1.549 m), last menstrual period 03/28/2012, SpO2 100.00%, unknown if currently breastfeeding.  Physical Exam  Constitutional: She is oriented to person, place, and time. She  appears well-developed and well-nourished. No distress.  HENT:  Head: Normocephalic and atraumatic.  Cardiovascular: Normal rate, regular rhythm and normal heart sounds.   Respiratory: Effort normal and breath sounds normal. No respiratory distress.  GI: Soft. Bowel sounds are normal. She exhibits no distension and no mass. There is tenderness (mild tenderness to palpation of the lower abdomen). There is no rebound and no guarding.  Genitourinary: Vagina normal and uterus normal. Uterus is not tender. Cervix exhibits discharge (small amount of mucus discharge noted at the cervical os. IUD strings present and appear normal\).  Neurological: She is alert and oriented to person, place, and time.  Skin: Skin is warm and dry. No erythema.  Psychiatric: She has a normal mood and affect.   Results for orders placed during the hospital encounter of 04/08/12 (from the past 24 hour(s))  POCT PREGNANCY, URINE     Status: None   Collection Time    04/08/12  3:02 PM      Result Value Range   Preg Test, Ur NEGATIVE  NEGATIVE    MAU Course  Procedures None  MDM Strings visible. Appear normal.  UPT negative. "movement" is most likely BM related.   Assessment and Plan  A: IUD in place  P: Discharge home Patient can follow-up with [facility] as needed Patient may return to MAU  as needed  Freddi Starr, PA-C  04/08/2012, 4:11 PM

## 2012-09-08 ENCOUNTER — Ambulatory Visit: Payer: Self-pay | Admitting: Family Medicine

## 2012-09-08 VITALS — BP 110/68 | HR 66 | Temp 97.8°F | Resp 16 | Wt 132.0 lb

## 2012-09-08 DIAGNOSIS — M791 Myalgia, unspecified site: Secondary | ICD-10-CM

## 2012-09-08 DIAGNOSIS — IMO0001 Reserved for inherently not codable concepts without codable children: Secondary | ICD-10-CM

## 2012-09-08 MED ORDER — METHOCARBAMOL 500 MG PO TABS: 500.0000 mg | ORAL_TABLET | Freq: Four times a day (QID) | ORAL | Status: DC

## 2012-09-08 MED ORDER — IBUPROFEN 600 MG PO TABS: 600.0000 mg | ORAL_TABLET | Freq: Three times a day (TID) | ORAL | Status: DC | PRN

## 2012-09-08 NOTE — Progress Notes (Signed)
[facility] [address] [place] [address] [phone]  Date:  09/08/2012   Name:  Brittany Mejia   DOB:  1980/05/28   MRN:  [id number]  PCP:  No primary provider on file.    Chief Complaint: Leg Pain and Shoulder Pain   History of Present Illness:  Brittany Mejia is a 32 y.o. very pleasant female patient who presents with the following:  She has noted pain in the back of her right leg when she stands up for a long time.  She works in [employer] and stands up a lot.  She has noted the pain for about 2 weeks.  She also feels that she has a similar problem with her right arm.  The muscle feels sore, and she has a hard time lifting anything heavy.    No changes at her job.   She has never had this in the past.   She is generally healthy, no other concerns or sx such as CP or SOB.   She has an IUD.   She has not yet tried any medication except for ibuprofen- this does help when she takes it at night, but the pain will come back the next day.    She will notice pain after she works for about 2 hours.  She feels like she is having "cramps" in her leg.    Non- smoker, no history of DVT. She is not breastfeeding.  She has an IUD in place.  Here today with her 2 children.    Patient Active Problem List   Diagnosis Date Noted  . Pregnancy, supervision of normal 03/28/2010  . ABNORMAL MATERNAL GLUCOSE TOLERANCE ANTEPARTUM 03/14/2010    No past medical history on file.  Past Surgical History  Procedure Laterality Date  . Appendectomy      Performed in [place] at age 82    History  Substance Use Topics  . Smoking status: Never Smoker   . Smokeless tobacco: Not on file  . Alcohol Use: No    No family history on file.  No Known Allergies  Medication list has been reviewed and updated.  No current outpatient prescriptions on file prior to visit.   No current facility-administered medications on file prior to visit.    Review  of Systems:  As per HPI- otherwise negative.   Physical Examination: Filed Vitals:   09/08/12 1144  BP: 110/68  Pulse: 66  Temp: 97.8 F (36.6 C)  Resp: 16   Filed Vitals:   09/08/12 1144  Weight: 132 lb (59.875 kg)   Body mass index is 24.95 kg/(m^2). Ideal Body Weight:    GEN: WDWN, NAD, Non-toxic, A & O x 3, looks well, excellent English, accompanied by her 2 children HEENT: Atraumatic, Normocephalic. Neck supple. No masses, No LAD. Ears and Nose: No external deformity. CV: RRR, No M/G/R. No JVD. No thrill. No extra heart sounds. PULM: CTA B, no wheezes, crackles, rhonchi. No retractions. No resp. distress. No accessory muscle use. EXTR: No c/c/e NEURO Normal gait.  PSYCH: Normally interactive. Conversant. Not depressed or anxious appearing.  Calm demeanor.  No swelling of right calf, no tenderness to exam.  Normal flexion/ extension of ankle and knee, normal DTR.  Achilles is intact and normal/ no thickening.   Right shoulder is normal, normal ROM.  Slight tenderness over anterior shoulder/ deltoid muscle.  No rash or lesion, no redness or heat.     Assessment and Plan: Muscle pain -  Plan: methocarbamol (ROBAXIN) 500 MG tablet, ibuprofen (ADVIL,MOTRIN) 600 MG tablet  Detailed discussion with pt.  I am certainly glad to perform further testing such as a CMP and doppler of her leg.  However, at this time she prefers a trial of medication.  She will let me know if not better in a few days, Sooner if worse.   If any CP or SOB she will seek help right away.    Signed Abbe Amsterdam, MD

## 2012-09-08 NOTE — Patient Instructions (Addendum)
Try the robaxin (muscle relaxer) for pain.  Let me know if your are not feeling better in the next few days.  You can call or email the office.  Let me know sooner if you are getting worse.   You can use the ibuprofen as well.

## 2013-01-27 ENCOUNTER — Ambulatory Visit: Payer: Self-pay | Admitting: Family Medicine

## 2013-01-27 VITALS — BP 112/64 | HR 88 | Temp 98.6°F | Resp 16 | Ht 61.0 in | Wt 136.6 lb

## 2013-01-27 DIAGNOSIS — N390 Urinary tract infection, site not specified: Secondary | ICD-10-CM

## 2013-01-27 DIAGNOSIS — R600 Localized edema: Secondary | ICD-10-CM

## 2013-01-27 DIAGNOSIS — M549 Dorsalgia, unspecified: Secondary | ICD-10-CM

## 2013-01-27 DIAGNOSIS — R319 Hematuria, unspecified: Secondary | ICD-10-CM

## 2013-01-27 DIAGNOSIS — R200 Anesthesia of skin: Secondary | ICD-10-CM

## 2013-01-27 LAB — POCT URINALYSIS DIPSTICK
Glucose, UA: NEGATIVE
Nitrite, UA: NEGATIVE
Protein, UA: NEGATIVE
Urobilinogen, UA: 0.2

## 2013-01-27 LAB — POCT UA - MICROSCOPIC ONLY
Casts, Ur, LPF, POC: NEGATIVE
Crystals, Ur, HPF, POC: NEGATIVE
Yeast, UA: NEGATIVE

## 2013-01-27 MED ORDER — FLUCONAZOLE 150 MG PO TABS: 150.0000 mg | ORAL_TABLET | Freq: Once | ORAL | Status: DC

## 2013-01-27 MED ORDER — CIPROFLOXACIN HCL 500 MG PO TABS: 500.0000 mg | ORAL_TABLET | Freq: Two times a day (BID) | ORAL | Status: DC

## 2013-01-27 NOTE — Progress Notes (Signed)
Subjective:    Patient ID: Brittany Mejia, female    DOB: [birth year], 32 y.o.   MRN: [id number] This chart was scribed for Norberto Sorenson, MD by Clydene Laming, ED Scribe. This patient was seen in room 4 and the patient's care was started at 4:13 PM. Chief Complaint  Patient presents with  . Back Pain    2 weeks    HPI HPI Comments: Brittany Mejia is a 32 y.o. female who [facility] complaining of bilateral flank pain onset two weeks ago. Pt has a hx of a possible kidney infection 3 mos ago and this feels the same. Pt denies nausea, vomiting, fever, or chills. She also denies numbness, tingling, and weakness of the legs. She also denies dysuria and vaginal discharge but is having some mild itching. Pt states for the past several days both of her hands and and feet have been swollen in the morning which improves during the day. Her left hand has also been tingling some in the morning. She has also been lacking energy throughout the days. She does not drink much water or eat much foods with high levels of sodium. She has been dealing with some MSK functional low back pain from work - works at [employer] and very busy on her feet all the time - this pain is definitely distinctly different from her low back pain.   Patient Active Problem List   Diagnosis Date Noted  . Pregnancy, supervision of normal 03/28/2010  . ABNORMAL MATERNAL GLUCOSE TOLERANCE ANTEPARTUM 03/14/2010   History reviewed. No pertinent past medical history. Past Surgical History  Procedure Laterality Date  . Appendectomy      Performed in [place] at age 65   No Known Allergies Prior to Admission medications   Medication Sig Start Date End Date Taking? Authorizing Provider  ciprofloxacin (CIPRO) 500 MG tablet Take 1 tablet (500 mg total) by mouth 2 (two) times daily. 01/27/13   Sherren Mocha, MD  fluconazole (DIFLUCAN) 150 MG tablet Take 1 tablet (150 mg total) by mouth once. Repeat  if needed 01/27/13   Sherren Mocha, MD  levonorgestrel [facility]) 20 MCG/24HR IUD 1 each by Intrauterine route once.    Historical Provider, MD  methocarbamol (ROBAXIN) 500 MG tablet Take 1 tablet (500 mg total) by mouth 4 (four) times daily. Use as needed for muscle pain. 09/08/12   Pearline Cables, MD   History   Social History  . Marital Status: Single    Spouse Name: N/A    Number of Children: N/A  . Years of Education: N/A   Occupational History  .     Social History Main Topics  . Smoking status: Never Smoker   . Smokeless tobacco: Not on file  . Alcohol Use: No  . Drug Use: No  . Sexual Activity: Yes    Birth Control/ Protection: IUD   Other Topics Concern  . Not on file   Social History Narrative  . No narrative on file       Review of Systems  Constitutional: Positive for fatigue. Negative for fever and chills.  Cardiovascular: Positive for leg swelling.  Gastrointestinal: Positive for abdominal pain. Negative for nausea and vomiting.  Genitourinary: Positive for flank pain. Negative for dysuria, frequency, hematuria, vaginal discharge and pelvic pain.  Musculoskeletal: Positive for back pain and myalgias.  Neurological: Negative for syncope, weakness and numbness.      BP 112/64  Pulse 88  Temp(Src)  98.6 F (37 C) (Oral)  Resp 16  Ht 5\' 1"  (1.549 m)  Wt 136 lb 9.6 oz (61.961 kg)  BMI 25.82 kg/m2  SpO2 99% Objective:   Physical Exam  Constitutional: She is oriented to person, place, and time. She appears well-developed and well-nourished. No distress.  HENT:  Head: Normocephalic and atraumatic.  Neck: Normal range of motion. Neck supple. No thyromegaly present.  Cardiovascular: Normal rate, regular rhythm, S1 normal, S2 normal, normal heart sounds and intact distal pulses.   No murmur heard. Pulses:      Dorsalis pedis pulses are 2+ on the right side, and 2+ on the left side.       Posterior tibial pulses are 2+ on the right side, and 2+ on the left  side.  Pulmonary/Chest: Effort normal and breath sounds normal. No respiratory distress.  Abdominal: Soft. Normal appearance and bowel sounds are normal. She exhibits no distension and no mass. There is no hepatosplenomegaly. There is no tenderness. There is CVA tenderness (bilateral). There is no rebound and no guarding. No hernia.  Musculoskeletal: She exhibits no edema.       Lumbar back: She exhibits no tenderness, no bony tenderness and no pain.  Lymphadenopathy:    She has no cervical adenopathy.  Neurological: She is alert and oriented to person, place, and time.  Negative tinel's, phalen's, and reverse phalen's  Skin: Skin is warm and dry. She is not diaphoretic. No erythema.  Psychiatric: She has a normal mood and affect. Her behavior is normal.      Results for orders placed in visit on 01/27/13  POCT UA - MICROSCOPIC ONLY      Result Value Range   WBC, Ur, HPF, POC 4-8     RBC, urine, microscopic 3-6     Bacteria, U Microscopic 3+     Mucus, UA small     Epithelial cells, urine per micros 5-10     Crystals, Ur, HPF, POC neg     Casts, Ur, LPF, POC neg     Yeast, UA neg    POCT URINALYSIS DIPSTICK      Result Value Range   Color, UA yellow     Clarity, UA hazy     Glucose, UA neg     Bilirubin, UA neg     Ketones, UA neg     Spec Grav, UA 1.020     Blood, UA moderate     pH, UA 5.0     Protein, UA neg     Urobilinogen, UA 0.2     Nitrite, UA neg     Leukocytes, UA small (1+)     Assessment & Plan:  Back pain - Plan: POCT UA - Microscopic Only, POCT urinalysis dipstick  UTI (urinary tract infection) - treat w/ cipro, follow w/ diflucan  Hematuria - rec recheck w/ cbc and renal function at next OV  Extremity edema - rec increase water, pt understands that if she is waking up w/ ext edema that is concerning and needs clinical eval - it is very new in onset so watchful waiting w/ pushing water for now. Pt will try to sched appt at [facility] for lab eval with cbc, cmp,  repeat ua, tsh, etc asap and will RTC here to self-pay for this if sxs cont.  Numbness and tingling in left hand  Meds ordered this encounter  Medications  . ciprofloxacin (CIPRO) 500 MG tablet    Sig: Take 1 tablet (500 mg total)  by mouth 2 (two) times daily.    Dispense:  6 tablet    Refill:  0  . fluconazole (DIFLUCAN) 150 MG tablet    Sig: Take 1 tablet (150 mg total) by mouth once. Repeat if needed    Dispense:  1 tablet    Refill:  1    I personally performed the services described in this documentation, which was scribed in my presence. The recorded information has been reviewed and considered, and addended by me as needed.  Norberto Sorenson, MD MPH

## 2013-01-27 NOTE — Patient Instructions (Signed)
Please call the clinic below to get an appointment asap.  They are a clinic set up by [facility] to care for people without health insurance so may be able to get you care at much cheaper cost with a large discount on labs, imaging, and medication.  I think that with the swelling in your legs and blood in your urine that you need blood work checking on your salts in your blood, your kidney function, and your blood counts. [facility] [address] [place], [place] [address] Hours of Operation Mon - Fri: 9 a.m. - 6 p.m. Main: [phone]  Urinary Tract Infection Urinary tract infections (UTIs) can develop anywhere along your urinary tract. Your urinary tract is your body's drainage system for removing wastes and extra water. Your urinary tract includes two kidneys, two ureters, a bladder, and a urethra. Your kidneys are a pair of bean-shaped organs. Each kidney is about the size of your fist. They are located below your ribs, one on each side of your spine. CAUSES Infections are caused by microbes, which are microscopic organisms, including fungi, viruses, and bacteria. These organisms are so small that they can only be seen through a microscope. Bacteria are the microbes that most commonly cause UTIs. SYMPTOMS  Symptoms of UTIs may vary by age and gender of the patient and by the location of the infection. Symptoms in young women typically include a frequent and intense urge to urinate and a painful, burning feeling in the bladder or urethra during urination. Older women and men are more likely to be tired, shaky, and weak and have muscle aches and abdominal pain. A fever may mean the infection is in your kidneys. Other symptoms of a kidney infection include pain in your back or sides below the ribs, nausea, and vomiting. DIAGNOSIS To diagnose a UTI, your caregiver will ask you about your symptoms. Your caregiver also will ask to provide a urine  sample. The urine sample will be tested for bacteria and white blood cells. White blood cells are made by your body to help fight infection. TREATMENT  Typically, UTIs can be treated with medication. Because most UTIs are caused by a bacterial infection, they usually can be treated with the use of antibiotics. The choice of antibiotic and length of treatment depend on your symptoms and the type of bacteria causing your infection. HOME CARE INSTRUCTIONS  If you were prescribed antibiotics, take them exactly as your caregiver instructs you. Finish the medication even if you feel better after you have only taken some of the medication.  Drink enough water and fluids to keep your urine clear or pale yellow.  Avoid caffeine, tea, and carbonated beverages. They tend to irritate your bladder.  Empty your bladder often. Avoid holding urine for long periods of time.  Empty your bladder before and after sexual intercourse.  After a bowel movement, women should cleanse from front to back. Use each tissue only once. SEEK MEDICAL CARE IF:   You have back pain.  You develop a fever.  Your symptoms do not begin to resolve within 3 days. SEEK IMMEDIATE MEDICAL CARE IF:   You have severe back pain or lower abdominal pain.  You develop chills.  You have nausea or vomiting.  You have continued burning or discomfort with urination. MAKE SURE YOU:   Understand these instructions.  Will watch your condition.  Will get help right away if you are not doing well or get worse.  Document Released: 11/14/2004 Document Revised: 08/06/2011 Document Reviewed: 03/15/2011 [facility] Patient Information 2014 [place], [place].

## 2013-04-03 ENCOUNTER — Emergency Department (HOSPITAL_COMMUNITY)
Admission: EM | Admit: 2013-04-03 | Discharge: 2013-04-03 | Disposition: A | Discharge status: Home / Self Care | Payer: Worker's Compensation | Attending: Emergency Medicine | Admitting: Emergency Medicine

## 2013-04-03 ENCOUNTER — Encounter (HOSPITAL_COMMUNITY): Payer: Self-pay | Admitting: Emergency Medicine

## 2013-04-03 DIAGNOSIS — S61217A Laceration without foreign body of left little finger without damage to nail, initial encounter: Secondary | ICD-10-CM

## 2013-04-03 DIAGNOSIS — Z23 Encounter for immunization: Secondary | ICD-10-CM | POA: Insufficient documentation

## 2013-04-03 DIAGNOSIS — Y9389 Activity, other specified: Secondary | ICD-10-CM | POA: Insufficient documentation

## 2013-04-03 DIAGNOSIS — Y990 Civilian activity done for income or pay: Secondary | ICD-10-CM | POA: Insufficient documentation

## 2013-04-03 DIAGNOSIS — Y929 Unspecified place or not applicable: Secondary | ICD-10-CM | POA: Insufficient documentation

## 2013-04-03 DIAGNOSIS — Z79899 Other long term (current) drug therapy: Secondary | ICD-10-CM | POA: Insufficient documentation

## 2013-04-03 DIAGNOSIS — S61209A Unspecified open wound of unspecified finger without damage to nail, initial encounter: Secondary | ICD-10-CM | POA: Insufficient documentation

## 2013-04-03 DIAGNOSIS — Z792 Long term (current) use of antibiotics: Secondary | ICD-10-CM | POA: Insufficient documentation

## 2013-04-03 DIAGNOSIS — W268XXA Contact with other sharp object(s), not elsewhere classified, initial encounter: Secondary | ICD-10-CM | POA: Insufficient documentation

## 2013-04-03 MED ORDER — TETANUS-DIPHTH-ACELL PERTUSSIS 5-2.5-18.5 LF-MCG/0.5 IM SUSP
0.5000 mL | Freq: Once | INTRAMUSCULAR | Status: AC
  Administered 2013-04-03: 0.5 mL via INTRAMUSCULAR
  Filled 2013-04-03: qty 0.5

## 2013-04-03 MED ORDER — IBUPROFEN 600 MG PO TABS: 600.0000 mg | ORAL_TABLET | Freq: Four times a day (QID) | ORAL | Status: DC | PRN

## 2013-04-03 NOTE — Discharge Instructions (Signed)
Return in 10 days for suture removal. Take ibuprofen for pain as needed. Return sooner if symptoms worsen or signs of infection develop.  Laceration Care, Adult A laceration is a cut or lesion that goes through all layers of the skin and into the tissue just beneath the skin. TREATMENT  Some lacerations may not require closure. Some lacerations may not be able to be closed due to an increased risk of infection. It is important to see your caregiver as soon as possible after an injury to minimize the risk of infection and maximize the opportunity for successful closure. If closure is appropriate, pain medicines may be given, if needed. The wound will be cleaned to help prevent infection. Your caregiver will use stitches (sutures), staples, wound glue (adhesive), or skin adhesive strips to repair the laceration. These tools bring the skin edges together to allow for faster healing and a better cosmetic outcome. However, all wounds will heal with a scar. Once the wound has healed, scarring can be minimized by covering the wound with sunscreen during the day for 1 full year. HOME CARE INSTRUCTIONS  For sutures or staples:  Keep the wound clean and dry.  If you were given a bandage (dressing), you should change it at least once a day. Also, change the dressing if it becomes wet or dirty, or as directed by your caregiver.  Wash the wound with soap and water 2 times a day. Rinse the wound off with water to remove all soap. Pat the wound dry with a clean towel.  After cleaning, apply a thin layer of the antibiotic ointment as recommended by your caregiver. This will help prevent infection and keep the dressing from sticking.  You may shower as usual after the first 24 hours. Do not soak the wound in water until the sutures are removed.  Only take over-the-counter or prescription medicines for pain, discomfort, or fever as directed by your caregiver.  Get your sutures or staples removed as directed by  your caregiver. For skin adhesive strips:  Keep the wound clean and dry.  Do not get the skin adhesive strips wet. You may bathe carefully, using caution to keep the wound dry.  If the wound gets wet, pat it dry with a clean towel.  Skin adhesive strips will fall off on their own. You may trim the strips as the wound heals. Do not remove skin adhesive strips that are still stuck to the wound. They will fall off in time. For wound adhesive:  You may briefly wet your wound in the shower or bath. Do not soak or scrub the wound. Do not swim. Avoid periods of heavy perspiration until the skin adhesive has fallen off on its own. After showering or bathing, gently pat the wound dry with a clean towel.  Do not apply liquid medicine, cream medicine, or ointment medicine to your wound while the skin adhesive is in place. This may loosen the film before your wound is healed.  If a dressing is placed over the wound, be careful not to apply tape directly over the skin adhesive. This may cause the adhesive to be pulled off before the wound is healed.  Avoid prolonged exposure to sunlight or tanning lamps while the skin adhesive is in place. Exposure to ultraviolet light in the first year will darken the scar.  The skin adhesive will usually remain in place for 5 to 10 days, then naturally fall off the skin. Do not pick at the adhesive film. You  may need a tetanus shot if:  You cannot remember when you had your last tetanus shot.  You have never had a tetanus shot. If you get a tetanus shot, your arm may swell, get red, and feel warm to the touch. This is common and not a problem. If you need a tetanus shot and you choose not to have one, there is a rare chance of getting tetanus. Sickness from tetanus can be serious. SEEK MEDICAL CARE IF:   You have redness, swelling, or increasing pain in the wound.  You see a red line that goes away from the wound.  You have yellowish-white fluid (pus) coming  from the wound.  You have a fever.  You notice a bad smell coming from the wound or dressing.  Your wound breaks open before or after sutures have been removed.  You notice something coming out of the wound such as wood or glass.  Your wound is on your hand or foot and you cannot move a finger or toe. SEEK IMMEDIATE MEDICAL CARE IF:   Your pain is not controlled with prescribed medicine.  You have severe swelling around the wound causing pain and numbness or a change in color in your arm, hand, leg, or foot.  Your wound splits open and starts bleeding.  You have worsening numbness, weakness, or loss of function of any joint around or beyond the wound.  You develop painful lumps near the wound or on the skin anywhere on your body. MAKE SURE YOU:   Understand these instructions.  Will watch your condition.  Will get help right away if you are not doing well or get worse. Document Released: 02/04/2005 Document Revised: 04/29/2011 Document Reviewed: 07/31/2010 [facility] Patient Information 2014 [place], [place].

## 2013-04-03 NOTE — ED Notes (Addendum)
Pt reports that she cut her left pinky finger while using a metal broom that had a jagged edge. Pt presents with workers compensation form, which requires post-accident drug and alcohol screen. Pt has 1 cm laceration to the the finger. Bleeding is controlled with dry guaze. Pt is unsure of her last tetanus vaccine. Pt is A/O x4, in NAD, and vitals are WDL. Arranged for patient to have workers compensation urine collected upon arrival to department.

## 2013-04-03 NOTE — ED Provider Notes (Signed)
CSN: [id number]     Arrival date & time 04/03/13  1929 History  This chart was scribed for Antony Madura, PA-C, non-physician practitioner working with No att. providers found by Nicholos Johns, ED scribe. This patient was seen in room WTR9/WTR9 and the patient's care was started at 8:18 PM.  Chief Complaint  Patient presents with  . Work Related Injury  . Laceration   The history is provided by the patient. No language interpreter was used.   HPI Comments: Brittany Mejia is a 33 y.o. female who presents to the Emergency Department complaining of a painful, throbbing, laceration to the left pinky finger occuring 1 hours ago; non radiating. 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 Pt denies the use of regular medications or blood thinners. Pt is unsure of her last tetanus shot.   History reviewed. No pertinent past medical history. Past Surgical History  Procedure Laterality Date  . Appendectomy      Performed in [place] at age 65   No family history on file. History  Substance Use Topics  . Smoking status: Never Smoker   . Smokeless tobacco: Not on file  . Alcohol Use: No   OB History   Grav Para Term Preterm Abortions TAB SAB Ect Mult Living   2 2 2  0 0     1     Review of Systems  Constitutional: Negative for fever.  Skin: Positive for wound. Negative for pallor.  Neurological: Negative for weakness and numbness.  All other systems reviewed and are negative.   Allergies  Review of patient's allergies indicates no known allergies.  Home Medications   Current Outpatient Rx  Name  Route  Sig  Dispense  Refill  . ciprofloxacin (CIPRO) 500 MG tablet   Oral   Take 1 tablet (500 mg total) by mouth 2 (two) times daily.   6 tablet   0   . fluconazole (DIFLUCAN) 150 MG tablet   Oral   Take 1 tablet (150 mg total) by mouth once. Repeat if needed   1 tablet   1   . levonorgestrel (MIRENA) 20 MCG/24HR IUD  Intrauterine   1 each by Intrauterine route once.         . methocarbamol (ROBAXIN) 500 MG tablet   Oral   Take 1 tablet (500 mg total) by mouth 4 (four) times daily. Use as needed for muscle pain.   30 tablet   0    Triage Vitals: BP 117/64  Pulse 92  Temp(Src) 98.4 F (36.9 C) (Oral)  Resp 20  SpO2 100%  Physical Exam  Nursing note and vitals reviewed. Constitutional: She is oriented to person, place, and time. She appears well-developed and well-nourished. No distress.  HENT:  Head: Normocephalic and atraumatic.  Eyes: Conjunctivae and EOM are normal. No scleral icterus.  Neck: Normal range of motion.  Cardiovascular: Normal rate, regular rhythm and intact distal pulses.   Distal radial pulses 2+ b/l. Capillary refill normal in all digits of L hand.  Pulmonary/Chest: Effort normal. No respiratory distress.  Musculoskeletal: Normal range of motion.  5/5 strength against resistance of FDP, FDS, and extensors of L 5th finger. Normal ROM of L hand; able to move all fingers.  Neurological: She is alert and oriented to person, place, and time.  Sensation to light touch intact to distal tip of all fingers of L hand.  Skin: Skin is warm and dry. No  rash noted. She is not diaphoretic. No erythema. No pallor.  1.5cm semicircular laceration to fat pad/palmar aspect of L 5th finger. Bleeding controlled.  Psychiatric: She has a normal mood and affect. Her behavior is normal.    ED Course  Procedures DIAGNOSTIC STUDIES: Oxygen Saturation is 100% on room air, normal by my interpretation.    COORDINATION OF CARE: At 8:18 PM: Discussed treatment plan with patient which includes stiches to repair the cut and a tetanus shot. Patient agrees.   LACERATION REPAIR PROCEDURE NOTE The patient's identification was confirmed and consent was obtained. This procedure was performed by No att. providers found at 8:20 PM. Site: left pinky finger Sterile procedures observed Anesthetic used (type  and amt): 2% Lidocaine w/o epinephrine Suture type/size:5-0 Prolene Length:1 cm # of Sutures: 3  Technique: simple interrupted Complexity Antibx ointment applied Tetanus UTD or ordered: Ordered Site anesthetized, irrigated with NS, explored without evidence of foreign body, wound well approximated, site covered with dry, sterile dressing.  Patient tolerated procedure well without complications. Instructions for care discussed verbally and patient provided with additional written instructions for homecare and f/u.  Labs Review Labs Reviewed - No data to display  Imaging Review No results found.  EKG Interpretation   None       MDM   Final diagnoses:  Laceration of fifth finger of left hand   Uncomplicated laceration of fifth finger of left hand. Patient neurovascularly intact. Sensation to light touch intact to distal tip of 5th finger. Patient with normal strength against resistance flexors and extensors of left fifth finger. Tetanus updated in ED today and laceration closed with 3 simple interrupted sutures. Patient hemodynamically stable and appropriate for discharge with instruction to return in 10 days for suture removal. Return precautions and wound care instructions provided. Patient agreeable to plan with no unaddressed concerns.  I personally performed the services described in this documentation, which was scribed in my presence. The recorded information has been reviewed and is accurate.      Antony MaduraKelly Kainon Varady, PA-C 04/03/13 2059

## 2013-04-04 NOTE — ED Provider Notes (Signed)
Medical screening examination/treatment/procedure(s) were performed by non-physician practitioner and as supervising physician I was immediately available for consultation/collaboration.  EKG Interpretation   None         Brittany ChurnJohn David Karielle Davidow III, MD 04/04/13 0005

## 2013-04-13 ENCOUNTER — Emergency Department (HOSPITAL_COMMUNITY)
Admission: EM | Admit: 2013-04-13 | Discharge: 2013-04-13 | Disposition: A | Discharge status: Home / Self Care | Payer: Worker's Compensation | Attending: Emergency Medicine | Admitting: Emergency Medicine

## 2013-04-13 DIAGNOSIS — Z4802 Encounter for removal of sutures: Secondary | ICD-10-CM | POA: Insufficient documentation

## 2013-04-13 DIAGNOSIS — Z792 Long term (current) use of antibiotics: Secondary | ICD-10-CM | POA: Insufficient documentation

## 2013-04-13 NOTE — Discharge Instructions (Signed)
Cuidados posteriores a la remoción de las grapas  (Staple Removal, Care After)  Las grapas utilizadas para suturar su piel han sido retiradas. La herida requiere un cuidado continuo de modo que pueda curarse completamente sin problemas. Los cuidados que se indican aquí deberán realizarse entre 5 y 10 días excepto que su médico le indique otra cosa.   INSTRUCCIONES PARA EL CUIDADO DOMICILIARIO  · Mantenga la herida limpia y seca.  · Si le aplicaron tiras de pegamento de la piel después de retirarle las grapas, se despegarán en algunos días. Si luego de 14 días permanecen en el lugar deben despegarse y descartarse.  · Si le han colocado un vendaje, cámbielo por lo menos una vez por día o según lo que le recomiende el médico. Si el vendaje se adhiere, remójelo con una solución de peróxido de hidrógeno (agua oxigenada). Seque dando palmaditas con un paño limpio y seco. Observe si existen signos de infección (ver más abajo).  · Vuelva a aplicar crema o ungüento según las indicaciones del médico. Esto le ayudará a prevenir las infecciones y a evitar que el vendaje se adhiera. Una venda no adhesiva sobre la herida y debajo del vendaje también evitará que éste se adhiera.  · Si el vendaje se moja, se ensucia o presenta un olor fétido, cámbielo tan pronto como pueda.  · Las nuevas cicatrices toman color oscuro fácilmente cuando se exponen al sol. Utilice pantallas solar con al menos un factor de protección (SPF) 15 cuando salga al sol.  · Sólo tome medicamentos de venta libre o prescriptos para calmar el dolor, las molestias, o bajar la fiebre según las indicaciones de su médico.  SOLICITE ATENCIÓN MÉDICA DE INMEDIATO SI:  · Presenta enrojecimiento, hinchazón o aumento del dolor en la herida.  · Aparece pus en la herida.  · Presenta una temperatura oral superior a 38,9° C (102° F).  · Advierte un olor fétido que proviene de la herida o del vendaje.  · La herida se abre (los bordes no se mantienen juntos) luego de la remoción  de las suturas.  Document Released: 05/03/2008 Document Revised: 04/29/2011  ExitCare® Patient Information ©2014 [employer].

## 2013-04-13 NOTE — ED Provider Notes (Signed)
CSN: [id number]     Arrival date & time 04/13/13  1552 History  This chart was scribed for non-physician practitioner, Roxy Horseman, PA-C working with Audree Camel, MD by Greggory Stallion, ED scribe. This patient was seen in room WTR6/WTR6 and the patient's care was started at 4:30 PM.   Chief Complaint  Patient presents with  . Suture / Staple Removal   The history is provided by the patient. No language interpreter was used.   HPI Comments: Brittany Mejia is a 33 y.o. female who presents to the Emergency Department for suture removal. Pt had 3 sutures placed in her left pinky finger 10 days ago after cutting it on a metal edge. States there has been no drainage from the area. Denies current pain, fever.   No past medical history on file. Past Surgical History  Procedure Laterality Date  . Appendectomy      Performed in [place] at age 79   No family history on file. History  Substance Use Topics  . Smoking status: Never Smoker   . Smokeless tobacco: Not on file  . Alcohol Use: No   OB History   Grav Para Term Preterm Abortions TAB SAB Ect Mult Living   2 2 2  0 0     1     Review of Systems  Constitutional: Negative for fever.  HENT: Negative for congestion.   Eyes: Negative for redness.  Respiratory: Negative for shortness of breath.   Cardiovascular: Negative for chest pain.  Gastrointestinal: Negative for abdominal pain.  Musculoskeletal: Negative for arthralgias and gait problem.  Skin: Positive for wound (well healing).  Neurological: Negative for speech difficulty.  Psychiatric/Behavioral: Negative for confusion.   Allergies  Review of patient's allergies indicates no known allergies.  Home Medications   Current Outpatient Rx  Name  Route  Sig  Dispense  Refill  . ciprofloxacin (CIPRO) 500 MG tablet   Oral   Take 1 tablet (500 mg total) by mouth 2 (two) times daily.   6 tablet   0   . fluconazole (DIFLUCAN) 150 MG tablet   Oral   Take 1 tablet  (150 mg total) by mouth once. Repeat if needed   1 tablet   1   . ibuprofen (ADVIL,MOTRIN) 600 MG tablet   Oral   Take 1 tablet (600 mg total) by mouth every 6 (six) hours as needed.   30 tablet   0   . levonorgestrel (MIRENA) 20 MCG/24HR IUD   Intrauterine   1 each by Intrauterine route once.         . methocarbamol (ROBAXIN) 500 MG tablet   Oral   Take 1 tablet (500 mg total) by mouth 4 (four) times daily. Use as needed for muscle pain.   30 tablet   0    BP 103/58  Pulse 67  Temp(Src) 98.5 F (36.9 C) (Oral)  Resp 16  SpO2 100%  Physical Exam  Nursing note and vitals reviewed. Constitutional: She is oriented to person, place, and time. She appears well-developed and well-nourished. No distress.  HENT:  Head: Normocephalic and atraumatic.  Eyes: Conjunctivae and EOM are normal.  Cardiovascular: Normal rate and regular rhythm.   Pulmonary/Chest: Effort normal and breath sounds normal. No stridor. No respiratory distress.  Abdominal: She exhibits no distension.  Musculoskeletal: She exhibits no edema.  Neurological: She is alert and oriented to person, place, and time. No cranial nerve deficit.  Skin: Skin is warm and dry.  Well healing  incision to left small finger. Sutures intact.   Psychiatric: She has a normal mood and affect.    ED Course  Procedures (including critical care time)  DIAGNOSTIC STUDIES: Oxygen Saturation is 100% on RA, normal by my interpretation.    COORDINATION OF CARE: 4:31 PM-Discussed treatment plan which includes suture removal with pt at bedside and pt agreed to plan.   Labs Review Labs Reviewed - No data to display Imaging Review No results found.  EKG Interpretation   None      SUTURE REMOVAL Performed by: Roxy HorsemanBROWNING, Achillies Buehl and Elvis Coilebecca Cooper, PA-S  Consent: Verbal consent obtained. Consent given by: patient Required items: required blood products, implants, devices, and special equipment available Time out:  Immediately prior to procedure a "time out" was called to verify the correct patient, procedure, equipment, support staff and site/side marked as required.  Location: left small finger  Wound Appearance: clean  Sutures/Staples Removed: 3  Patient tolerance: Patient tolerated the procedure well with no immediate complications.    MDM   Final diagnoses:  Visit for suture removal      I personally performed the services described in this documentation, which was scribed in my presence. The recorded information has been reviewed and is accurate.    Roxy Horsemanobert Romon Devereux, PA-C 04/13/13 [phone]

## 2013-04-13 NOTE — ED Notes (Signed)
Pt here for suture removal of sutures to L pinkie finger. Pt had sutures placed 10 days ago. Wound has no redness, swelling or pus drainage.

## 2013-04-14 NOTE — ED Provider Notes (Signed)
Medical screening examination/treatment/procedure(s) were performed by non-physician practitioner and as supervising physician I was immediately available for consultation/collaboration.  EKG Interpretation   None         Robi Mitter T Ketra Duchesne, MD 04/14/13 0001 

## 2013-06-10 ENCOUNTER — Ambulatory Visit (INDEPENDENT_AMBULATORY_CARE_PROVIDER_SITE_OTHER): Payer: PRIVATE HEALTH INSURANCE | Admitting: Gynecology

## 2013-06-10 ENCOUNTER — Other Ambulatory Visit (HOSPITAL_COMMUNITY)
Admission: RE | Admit: 2013-06-10 | Discharge: 2013-06-10 | Disposition: A | Discharge status: Home / Self Care | Payer: PRIVATE HEALTH INSURANCE | Source: Ambulatory Visit | Attending: Gynecology | Admitting: Gynecology

## 2013-06-10 ENCOUNTER — Encounter: Payer: Self-pay | Admitting: Gynecology

## 2013-06-10 ENCOUNTER — Telehealth: Payer: Self-pay | Admitting: *Deleted

## 2013-06-10 VITALS — BP 124/78 | Ht 60.5 in | Wt 132.0 lb

## 2013-06-10 DIAGNOSIS — Z1151 Encounter for screening for human papillomavirus (HPV): Secondary | ICD-10-CM | POA: Insufficient documentation

## 2013-06-10 DIAGNOSIS — N6320 Unspecified lump in the left breast, unspecified quadrant: Secondary | ICD-10-CM | POA: Insufficient documentation

## 2013-06-10 DIAGNOSIS — Z124 Encounter for screening for malignant neoplasm of cervix: Secondary | ICD-10-CM | POA: Insufficient documentation

## 2013-06-10 DIAGNOSIS — N63 Unspecified lump in unspecified breast: Secondary | ICD-10-CM

## 2013-06-10 DIAGNOSIS — Z01419 Encounter for gynecological examination (general) (routine) without abnormal findings: Secondary | ICD-10-CM

## 2013-06-10 LAB — CBC WITH DIFFERENTIAL/PLATELET
BASOS PCT: 1 % (ref 0–1)
Basophils Absolute: 0.1 10*3/uL (ref 0.0–0.1)
Eosinophils Absolute: 0.3 10*3/uL (ref 0.0–0.7)
Eosinophils Relative: 4 % (ref 0–5)
HEMATOCRIT: 37.7 % (ref 36.0–46.0)
HEMOGLOBIN: 13 g/dL (ref 12.0–15.0)
Lymphocytes Relative: 33 % (ref 12–46)
Lymphs Abs: 2.1 10*3/uL (ref 0.7–4.0)
MCH: 29.7 pg (ref 26.0–34.0)
MCHC: 34.5 g/dL (ref 30.0–36.0)
MCV: 86.3 fL (ref 78.0–100.0)
MONO ABS: 0.5 10*3/uL (ref 0.1–1.0)
MONOS PCT: 7 % (ref 3–12)
NEUTROS ABS: 3.6 10*3/uL (ref 1.7–7.7)
Neutrophils Relative %: 55 % (ref 43–77)
Platelets: 291 10*3/uL (ref 150–400)
RBC: 4.37 MIL/uL (ref 3.87–5.11)
RDW: 13.5 % (ref 11.5–15.5)
WBC: 6.5 10*3/uL (ref 4.0–10.5)

## 2013-06-10 LAB — COMPREHENSIVE METABOLIC PANEL
ALBUMIN: 4.3 g/dL (ref 3.5–5.2)
ALK PHOS: 73 U/L (ref 39–117)
ALT: 10 U/L (ref 0–35)
AST: 15 U/L (ref 0–37)
BUN: 12 mg/dL (ref 6–23)
CALCIUM: 9 mg/dL (ref 8.4–10.5)
CHLORIDE: 103 meq/L (ref 96–112)
CO2: 26 mEq/L (ref 19–32)
Creat: 0.63 mg/dL (ref 0.50–1.10)
GLUCOSE: 76 mg/dL (ref 70–99)
POTASSIUM: 3.6 meq/L (ref 3.5–5.3)
Sodium: 136 mEq/L (ref 135–145)
Total Bilirubin: 0.5 mg/dL (ref 0.2–1.2)
Total Protein: 7.3 g/dL (ref 6.0–8.3)

## 2013-06-10 LAB — CHOLESTEROL, TOTAL: CHOLESTEROL: 182 mg/dL (ref 0–200)

## 2013-06-10 NOTE — Patient Instructions (Signed)
Autoexamen de mamas  (Breast Self-Awareness) El autoexamen de mamas puede detectar problemas de manera temprana, prevenir complicaciones mdicas significativas y posiblemente salvar su vida. Al hacerlo, podr familiarizarse con el aspecto y forma de sus mamas, y observar cambios. Esto le permite descubrir cambios de manera precoz. Este autoexamen mensual le ofrece la tranquilidad de que sus senos estn en buen estado de salud. Una forma de aprender qu es normal para sus mamas y si sufren modificaciones es hacer un autoexamen.   Si encuentra un bulto o algo que no estaba presente anteriormente, lo mejor es ponerse en contacto con su mdico inmediatamente. Otro hallazgo que debe ser evaluado por su mdico es la secrecin del pezn, especialmente si es con sangre; cambios en la piel o enrojecimiento; reas donde la piel parece estar tironeada (retrada) o nuevos bultos o protuberancias. El dolor en los senos es rara vez se asocia con el cncer (malignidad), pero tambin debe ser evaluado por un mdico.  CMO REALIZAR EL AUTOEXAMEN DE MAMAS  El mejor momento para examinar sus mamas es a los 5 a 7 das despus de finalizado el perodo menstrual. Durante la menstruacin, las mamas estn ms abultadas y puede haber ms dificultad para encontrar modificaciones. Si no menstra, ha llegado a la menopausia, o le han extirpado el tero (histerectomia), usted debe examinar sus senos a intervalos regulares, por ejemplo cada mes. Si est amamantando, examine sus senos despus de alimentar al beb o despus de usar un extractor de leche. Los implantes mamarios no disminuyen el riesgo de bultos o tumores, por lo que debe seguir realizando el autoexamen de mamas como se recomienda. Hable con su mdico acerca de cmo determinar la diferencia entre el implante y el tejido mamario. Adems, debe consultar cuanta presin debe hacer durante el examen. Con el tiempo se familiarizar con las variaciones de las mamas y se sentir ms  cmoda para realizar el examen. Para el autoexamen deber quitarse toda la ropa de la cintura para arriba.  1.  Observe sus senos y pezones. Prese frente a un espejo en una habitacin con buena iluminacin. Con las manos en las caderas, presione las manos firmemente hacia abajo. Busque diferencias en la forma, el contorno y el tamao de un pecho al otro (asimetras). Entre las asimetras se incluyen arrugas, depresiones o protuberancias. Tambin, busque cambios en la piel, como reas enrojecidas o escamosas. Busque cambios en los pezones, como secreciones, hoyuelos, cambios en la posicin, o enrojecimiento. 2. Palpe cuidadosamente sus senos. Es mucho mejor hacerlo en la ducha o en la baera, mientras usa jabn o cuando est recostada sobre su espalda. Coloque el brazo (en el lado de la mama que se examina) por arriba de la cabeza. Use las yemas (no las puntas) de los tres dedos centrales de la mano opuesta para palpar. Comience en la zona de la axila, haga crculos de  de pulgada (2 cm) y vaya superponindolos. Utilice 3 niveles diferentes de presin (ligero, medio y firme) en cada crculo antes de pasar al siguiente. Se necesita una presin ligera para sentir los tejidos ms cercanos a la piel. La presin media ayudar a sentir el tejido mamario un poco ms profundo, mientras que se necesita una presin firme para palpar el tejido que se encuentra cerca de las costillas. Continuar superponiendo crculos y vaya hacia abajo, hasta sentir las costillas, por debajo del pecho. Luego mueva un espacio del ancho de un dedo hacia el centro del cuerpo. Siga con los crculos del  de pulgada (  2 cm) mientras va lentamente hacia la clavcula, cerca de la base del cuello. Contine con el examen hacia arriba y hacia abajo con las 3 intensidades de presin hasta llegar a la mitad del pecho. Hgalo con cada seno cuidadosamente, buscando bultos o modificaciones. 3. Debe llevar un registro escrito con los cambios o los hallazgos  normales que encuentre para cada seno. Si registra esta informacin, no tiene que depender slo de la memoria para recordar el tamao, la sensibilidad o la ubicacin de los hallazgos. Anote en qu momento se encuentra del ciclo menstrual, si usted todava est menstruando. El tejido mamario puede tener algunos bultos o tejidos engrosados. Sin embargo, consulte a su mdico si usted encuentra algo que le preocupa.   SOLICITE ATENCIN MDICA SI:   Observa cambios en la forma, en el contorno o el tamao de las mamas o los pezones.   Hay modificaciones en la piel, como zonas enrojecidas o escamosas en las mamas o en los pezones.   Tiene una secrecin anormal en los pezones.   Siente un nuevo bulto o reas engrosadas de manera anormal.  Document Released: 02/04/2005 Document Revised: 01/22/2012 Exit[employer] Patient Information 2014 [employer].  

## 2013-06-10 NOTE — Telephone Encounter (Signed)
Orders placed they will contact patient.

## 2013-06-10 NOTE — Telephone Encounter (Signed)
Message copied by Aura CampsWEBB, JENNIFER L on Thu Jun 10, 2013  2:00 PM ------      Message from: Ok EdwardsFERNANDEZ, JUAN H      Created: Thu Jun 10, 2013 12:27 PM       Victorino DikeJennifer, please schedule diagnostic mammogram for this patient at the breast center any time of the week preferably after 3 PM.             Patient on annual exam was found to have a left breast mass located 3 fingerbreadths from the areolar region at the 2:00 position measuring approximately 1-1/2 cm to 2 cm in size mobile and nontender. Patient had no supraclavicular or axillary lymphadenopathy and contralateral breast was normal.             ------

## 2013-06-10 NOTE — Progress Notes (Signed)
Brittany LundVeronica Mejia 01/16/1981 [id number]   History:    33 y.o.  for annual gyn exam who is a new patient to the practice. Patient has not had a gynecological exam since 2012 at time of her postpartum visit. At that time patient had a Mirena IUD placed. She has had 2 pregnancies delivered vaginally at term with no complications. Patient denies any prior history of abnormal Pap smears. 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  Past medical history,surgical history, family history and social history were all reviewed and documented in the EPIC chart.  Gynecologic History No LMP recorded. Patient is not currently having periods (Reason: IUD). Contraception: IUD Last Pap: 2012. Results were: normal Last mammogram: Not indicated. Results were: Not indicated  Obstetric History OB History  Gravida Para Term Preterm AB SAB TAB Ectopic Multiple Living  2 2 2  0 0     1    # Outcome Date GA Lbr Len/2nd Weight Sex Delivery Anes PTL Lv  2 TRM 06/08/10 2965w0d  8 lb (3.629 kg) M SVD     1 TRM 01/04/03 5251w0d  6 lb 13 oz (3.09 kg)  SVD  N        ROS: A ROS was performed and pertinent positives and negatives are included in the history.  GENERAL: No fevers or chills. HEENT: No change in vision, no earache, sore throat or sinus congestion. NECK: No pain or stiffness. CARDIOVASCULAR: No chest pain or pressure. No palpitations. PULMONARY: No shortness of breath, cough or wheeze. GASTROINTESTINAL: No abdominal pain, nausea, vomiting or diarrhea, melena or bright red blood per rectum. GENITOURINARY: No urinary frequency, urgency, hesitancy or dysuria. MUSCULOSKELETAL: No joint or muscle pain, no back pain, no recent trauma. DERMATOLOGIC: No rash, no itching, no lesions. ENDOCRINE: No polyuria, polydipsia, no heat or cold intolerance. No recent change in weight. HEMATOLOGICAL: No  anemia or easy bruising or bleeding. NEUROLOGIC: No headache, seizures, numbness, tingling or weakness. PSYCHIATRIC: No depression, no loss of interest in normal activity or change in sleep pattern.     Exam: chaperone present  BP 124/78  Ht 5' 0.5" (1.537 m)  Wt 132 lb (59.875 kg)  BMI 25.35 kg/m2  Body mass index is 25.35 kg/(m^2).  General appearance : Well developed well nourished female. No acute distress HEENT: Neck supple, trachea midline, no carotid bruits, no thyroidmegaly Lungs: Clear to auscultation, no rhonchi or wheezes, or rib retractions  Heart: Regular rate and rhythm, no murmurs or gallops  Breast:  a left breast mass located 3 fingerbreadths from the areolar region at the 2:00 position measuring approximately 1-1/2 cm to 2 cm in size mobile and nontender. Patient had no supraclavicular or axillary lymphadenopathy and contralateral breast was normal.  Abdomen: no palpable masses or tenderness, no rebound or guarding Extremities: no edema or skin discoloration or tenderness  Pelvic:  Bartholin, Urethra, Skene Glands: Within normal limits             Vagina: No gross lesions or discharge  Cervix: No gross lesions or discharge, IUD string seen  Uterus  anteverted, normal size, shape and consistency, non-tender and mobile  Adnexa  Without masses or tenderness  Anus and perineum  normal   Rectovaginal  normal sphincter tone without palpated masses or tenderness  Hemoccult not indicated     Assessment/Plan:  33 y.o. female on annual exam was found to have a left breast mass located 3 fingerbreadths from the areolar region at the 2:00 position measuring approximately 1-1/2 cm to 2 cm in size mobile and nontender. Patient had no supraclavicular or axillary lymphadenopathy and contralateral breast was normal. Patient will be sent for diagnostic mammogram. The following lab work today: CBC, screen cholesterol, comprehensive metabolic panel, CBC, urinalysis as well  as Pap smear.    Note: This dictation was prepared with  Dragon/digital dictation along withSmart phrase technology. Any transcriptional errors that result from this process are unintentional.   Ok EdwardsJuan H Marvelous Woolford MD, 12:29 PM 06/10/2013

## 2013-06-11 LAB — URINALYSIS W MICROSCOPIC + REFLEX CULTURE
Bacteria, UA: NONE SEEN
Bilirubin Urine: NEGATIVE
Casts: NONE SEEN
Glucose, UA: NEGATIVE mg/dL
HGB URINE DIPSTICK: NEGATIVE
Ketones, ur: NEGATIVE mg/dL
LEUKOCYTES UA: NEGATIVE
NITRITE: NEGATIVE
PROTEIN: NEGATIVE mg/dL
Specific Gravity, Urine: 1.025 (ref 1.005–1.030)
Squamous Epithelial / LPF: NONE SEEN
UROBILINOGEN UA: 0.2 mg/dL (ref 0.0–1.0)
pH: 5.5 (ref 5.0–8.0)

## 2013-06-11 LAB — TSH: TSH: 1.662 u[IU]/mL (ref 0.350–4.500)

## 2013-06-15 NOTE — Telephone Encounter (Signed)
Appointment 06/18/13 @ 11:15 am

## 2013-06-18 ENCOUNTER — Ambulatory Visit
Admission: RE | Admit: 2013-06-18 | Discharge: 2013-06-18 | Disposition: A | Discharge status: Home / Self Care | Payer: PRIVATE HEALTH INSURANCE | Source: Ambulatory Visit | Attending: Gynecology | Admitting: Gynecology

## 2013-06-18 DIAGNOSIS — N63 Unspecified lump in unspecified breast: Secondary | ICD-10-CM

## 2013-07-01 ENCOUNTER — Ambulatory Visit (INDEPENDENT_AMBULATORY_CARE_PROVIDER_SITE_OTHER): Payer: PRIVATE HEALTH INSURANCE | Admitting: Emergency Medicine

## 2013-07-01 VITALS — BP 110/69 | HR 93 | Temp 98.8°F | Resp 16 | Ht 60.0 in | Wt 133.8 lb

## 2013-07-01 DIAGNOSIS — R109 Unspecified abdominal pain: Secondary | ICD-10-CM

## 2013-07-01 DIAGNOSIS — A088 Other specified intestinal infections: Secondary | ICD-10-CM

## 2013-07-01 LAB — POCT URINALYSIS DIPSTICK
BILIRUBIN UA: NEGATIVE
Glucose, UA: NEGATIVE
Ketones, UA: NEGATIVE
NITRITE UA: NEGATIVE
PH UA: 5.5
Protein, UA: NEGATIVE
Urobilinogen, UA: 0.2

## 2013-07-01 LAB — POCT CBC
Granulocyte percent: 81.7 %G — AB (ref 37–80)
HCT, POC: 43.1 % (ref 37.7–47.9)
HEMOGLOBIN: 14.4 g/dL (ref 12.2–16.2)
LYMPH, POC: 1 (ref 0.6–3.4)
MCH: 30.4 pg (ref 27–31.2)
MCHC: 33.4 g/dL (ref 31.8–35.4)
MCV: 91 fL (ref 80–97)
MID (CBC): 0.3 (ref 0–0.9)
MPV: 10.3 fL (ref 0–99.8)
PLATELET COUNT, POC: 246 10*3/uL (ref 142–424)
POC Granulocyte: 5.6 (ref 2–6.9)
POC LYMPH PERCENT: 14.4 %L (ref 10–50)
POC MID %: 3.9 % (ref 0–12)
RBC: 4.74 M/uL (ref 4.04–5.48)
RDW, POC: 12.4 %
WBC: 6.9 10*3/uL (ref 4.6–10.2)

## 2013-07-01 LAB — POCT UA - MICROSCOPIC ONLY
Bacteria, U Microscopic: NEGATIVE
CRYSTALS, UR, HPF, POC: NEGATIVE
Casts, Ur, LPF, POC: NEGATIVE
WBC, Ur, HPF, POC: NEGATIVE
Yeast, UA: NEGATIVE

## 2013-07-01 LAB — POCT URINE PREGNANCY: Preg Test, Ur: NEGATIVE

## 2013-07-01 MED ORDER — ONDANSETRON 8 MG PO TBDP: 8.0000 mg | ORAL_TABLET | Freq: Three times a day (TID) | ORAL | Status: DC | PRN

## 2013-07-01 NOTE — Patient Instructions (Signed)
Dolor abdominal en adultos (Abdominal Pain, Adult) El dolor puede tener muchas causas. Normalmente la causa del dolor abdominal no es una enfermedad y Scientist, clinical (histocompatibility and immunogenetics)mejorar sin TEFL teachertratamiento. Frecuentemente puede controlarse y tratarse en casa. Su mdico le Medical sales representativerealizar un examen fsico y posiblemente solicite anlisis de sangre y radiografas para ayudar a Chief Strategy Officerdeterminar la gravedad de su dolor. Sin embargo, en IAC/InterActiveCorpmuchos casos, debe transcurrir ms tiempo antes de que se pueda Clinical research associateencontrar una causa evidente del dolor. Antes de llegar a ese punto, es posible que su mdico no sepa si necesita ms pruebas o un tratamiento ms profundo. INSTRUCCIONES PARA EL CUIDADO EN EL HOGAR  Est atento al dolor para ver si hay cambios. Las siguientes indicaciones ayudarn a Architectural technologistaliviar cualquier molestia que pueda sentir:  Finderneome solo medicamentos de venta libre o recetados, segn las indicaciones del mdico.  No tome laxantes a menos que se lo haya indicado su mdico.  Pruebe con Neomia Dearuna dieta lquida absoluta (caldo, t o agua) segn se lo indique su mdico. Introduzca gradualmente una dieta normal, segn su tolerancia. SOLICITE ATENCIN MDICA SI:  Tiene dolor abdominal sin explicacin.  Tiene dolor abdominal relacionado con nuseas o diarrea.  Tiene dolor cuando orina o defeca.  Experimenta dolor abdominal que lo despierta de noche.  Tiene dolor abdominal que empeora o mejora cuando come alimentos.  Tiene dolor abdominal que empeora cuando come alimentos grasosos. SOLICITE ATENCIN MDICA DE INMEDIATO SI:   El dolor no desaparece en un plazo mximo de 2horas.  Tiene fiebre.  No deja de (vomitar).  El Engineer, miningdolor se siente solo en partes del abdomen, como el lado derecho o la parte inferior izquierda del abdomen.  Evaca materia fecal sanguinolenta o negra, de aspecto alquitranado. ASEGRESE DE QUE:  Comprende estas instrucciones.  Controlar su afeccin.  Recibir ayuda de inmediato si no mejora o si empeora. Document  Released: 02/04/2005 Document Revised: 11/25/2012 [facility] Patient Information 2014 [place] ParkExitCare, [place]LLC. Diet The clear liquid diet consists of foods that are liquid or will become liquid at room temperature. Examples of foods allowed on a clear liquid diet include fruit juice, broth or bouillon, gelatin, or frozen ice pops. You should be able to see through the liquid. The purpose of this diet is to provide the necessary fluids, electrolytes (such as sodium and potassium), and energy to keep the body functioning during times when you are not able to consume a regular diet. A clear liquid diet should not be continued for long periods of time, as it is not nutritionally adequate.  A CLEAR LIQUID DIET MAY BE NEEDED:  When a sudden-onset (acute) condition occurs before or after surgery.   As the first step in oral feeding.   For fluid and electrolyte replacement in diarrheal diseases.   As a diet before certain medical tests are performed.  ADEQUACY The clear liquid diet is adequate only in ascorbic acid, according to the Recommended Dietary Allowances of the [employer]xon [employer]ational Research Council.  CHOOSING FOODS Breads and Starches  Allowed: None are allowed.   Avoid: All are to be avoided.  Vegetables  Allowed: Strained vegetable juices.   Avoid: Any others.  Fruit  Allowed: Strained fruit juices and fruit drinks. Include 1 serving of citrus or vitamin C-enriched fruit juice daily.   Avoid: Any others.  Meat and Meat Substitutes  Allowed: None are allowed.   Avoid: All are to be avoided.  Milk Products  Allowed: None are allowed.   Avoid: All are to be avoided.  Soups and Combination Foods  Allowed:  Clear bouillon, broth, or strained broth-based soups.   Avoid: Any others.  Desserts and Sweets  Allowed: Sugar, honey. High-protein gelatin. Flavored gelatin, ices, or frozen ice pops that do not contain milk.   Avoid: Any others.  Fats and  Oils  Allowed: None are allowed.   Avoid: All are to be avoided.  Beverages  Allowed: Cereal beverages, coffee (regular or decaffeinated), tea, or soda at the discretion of your health care provider.   Avoid: Any others.  Condiments  Allowed: Salt.   Avoid: Any others, including pepper.  Supplements  Allowed: Liquid nutrition beverages that you can see through.   Avoid: Any others that contain lactose or fiber. SAMPLE MEAL PLAN Breakfast  4 oz (120 mL) strained orange juice.   to 1 cup (120 to 240 mL) gelatin (plain or fortified).  1 cup (240 mL) beverage (coffee or tea).  Sugar, if desired. Midmorning Snack   cup (120 mL) gelatin (plain or fortified). Lunch  1 cup (240 mL) broth or consomm.  4 oz (120 mL) strained grapefruit juice.   cup (120 mL) gelatin (plain or fortified).  1 cup (240 mL) beverage (coffee or tea).  Sugar, if desired. Midafternoon Snack   cup (120 mL) fruit ice.   cup (120 mL) strained fruit juice. Dinner  1 cup (240 mL) broth or consomm.   cup (120 mL) cranberry juice.   cup (120 mL) flavored gelatin (plain or fortified).  1 cup (240 mL) beverage (coffee or tea).  Sugar, if desired. Evening Snack  4 oz (120 mL) strained apple juice (vitamin C-fortified).   cup (120 mL) flavored gelatin (plain or fortified). MAKE SURE YOU:  Understand these instructions.  Will watch your child's condition.  Will get help right away if your child is not doing well or gets worse. Document Released: 02/04/2005 Document Revised: 10/07/2012 Document Reviewed: 07/07/2012 [facility]Exit[facility] Patient Information 2014 [place]ExitCare, [place]LLC.

## 2013-07-01 NOTE — Progress Notes (Signed)
[facility] [address], [place] [address] [phone]  Date:  07/01/2013   Name:  Brittany Mejia   DOB:  [birth year]   MRN:  [id number]  PCP:  No primary provider on file.    Chief Complaint: Abdominal Pain and Emesis   History of Present Illness:  Brittany Mejia is a 33 y.o. very pleasant female patient who presents with the following:  Ill since last night with lower abdominal pain and nausea.  Vomited once this morning.  No loose stool.  Anorectic.  No fever or chills.   No rash, cough or coryza.  Appendix out.  No food intolerance. IUD.  No vaginal discharge.  No dysuria, urgency or frequency.  No ill contacts  Works in Personnel officerfood service.  Feels ill.  No improvement with over the counter medications or other home remedies. Denies other complaint or health concern today.   Patient Active Problem List   Diagnosis Date Noted  . Left breast mass 06/10/2013  . Pregnancy, supervision of normal 03/28/2010  . ABNORMAL MATERNAL GLUCOSE TOLERANCE ANTEPARTUM 03/14/2010    No past medical history on file.  Past Surgical History  Procedure Laterality Date  . Appendectomy      [place] in [place] age 89    History  Substance Use Topics  . Smoking status: Never Smoker   . Smokeless tobacco: Not on file  . Alcohol Use: Yes     Comment: OCC    Family History  Problem Relation Age of Onset  . Diabetes Mother     No Known Allergies  Medication list has been reviewed and updated.  Current Outpatient Prescriptions on File Prior to Visit  Medication Sig Dispense Refill  . ibuprofen (ADVIL,MOTRIN) 600 MG tablet Take 1 tablet (600 mg total) by mouth every 6 (six) hours as needed.  30 tablet  0  . levonorgestrel (MIRENA) 20 MCG/24HR IUD 1 each by Intrauterine route once.       No current facility-administered medications on file prior to visit.    Review of Systems:  As per HPI, otherwise negative.    Physical Examination: Filed Vitals:    07/01/13 1311  BP: 110/69  Pulse: 93  Temp: 98.8 F (37.1 C)  Resp: 16   Filed Vitals:   07/01/13 1311  Height: 5' (1.524 m)  Weight: 133 lb 12.8 oz (60.691 kg)   Body mass index is 26.13 kg/(m^2). Ideal Body Weight: Weight in (lb) to have BMI = 25: 127.7  GEN: WDWN, NAD, Non-toxic, A & O x 3 HEENT: Atraumatic, Normocephalic. Neck supple. No masses, No LAD. Ears and Nose: No external deformity. CV: RRR, No M/G/R. No JVD. No thrill. No extra heart sounds. PULM: CTA B, no wheezes, crackles, rhonchi. No retractions. No resp. distress. No accessory muscle use. ABD: S, tender lower abdomen  R>L, ND, +BS. No rebound. No HSM. EXTR: No c/c/e NEURO Normal gait.  PSYCH: Normally interactive. Conversant. Not depressed or anxious appearing.  Calm demeanor.    Assessment and Plan: Gastroenteritis zofran Clears Red flag discussed   Signed,  Phillips OdorJeffery Ulyses Panico, MD   Results for orders placed in visit on 07/01/13  POCT CBC      Result Value Ref Range   WBC 6.9  4.6 - 10.2 K/uL   Lymph, poc 1.0  0.6 - 3.4   POC LYMPH PERCENT 14.4  10 - 50 %L   MID (cbc) 0.3  0 - 0.9   POC MID % 3.9  0 - 12 %M   POC Granulocyte 5.6  2 - 6.9   Granulocyte percent 81.7 (*) 37 - 80 %G   RBC 4.74  4.04 - 5.48 M/uL   Hemoglobin 14.4  12.2 - 16.2 g/dL   HCT, POC 40.943.1  81.137.7 - 47.9 %   MCV 91.0  80 - 97 fL   MCH, POC 30.4  27 - 31.2 pg   MCHC 33.4  31.8 - 35.4 g/dL   RDW, POC 91.412.4     Platelet Count, POC 246  142 - 424 K/uL   MPV 10.3  0 - 99.8 fL  POCT URINALYSIS DIPSTICK      Result Value Ref Range   Color, UA yellow     Clarity, UA clear     Glucose, UA neg     Bilirubin, UA neg     Ketones, UA neg     Spec Grav, UA <=1.005     Blood, UA moderate     pH, UA 5.5     Protein, UA neg     Urobilinogen, UA 0.2     Nitrite, UA neg     Leukocytes, UA small (1+)    POCT URINE PREGNANCY      Result Value Ref Range   Preg Test, Ur Negative    POCT UA - MICROSCOPIC ONLY      Result Value Ref  Range   WBC, Ur, HPF, POC neg     RBC, urine, microscopic 0-3     Bacteria, U Microscopic neg     Mucus, UA trace     Epithelial cells, urine per micros 0-2     Crystals, Ur, HPF, POC neg     Casts, Ur, LPF, POC neg     Yeast, UA neg

## 2013-08-09 ENCOUNTER — Ambulatory Visit (INDEPENDENT_AMBULATORY_CARE_PROVIDER_SITE_OTHER): Payer: PRIVATE HEALTH INSURANCE | Admitting: Emergency Medicine

## 2013-08-09 VITALS — BP 100/64 | HR 62 | Temp 97.8°F | Resp 18 | Ht 61.0 in | Wt 133.2 lb

## 2013-08-09 DIAGNOSIS — G44209 Tension-type headache, unspecified, not intractable: Secondary | ICD-10-CM

## 2013-08-09 MED ORDER — BUTALBITAL-APAP-CAFFEINE 50-325-40 MG PO TABS: 1.0000 | ORAL_TABLET | Freq: Four times a day (QID) | ORAL | Status: AC | PRN

## 2013-08-09 NOTE — Progress Notes (Signed)
[facility] [address], [place] [address] [phone]  Date:  08/09/2013   Name:  Brittany Mejia   DOB:  September 08, 1980   MRN:  [id number]  PCP:  No PCP Per Patient    Chief Complaint: Sinusitis   History of Present Illness:  Brittany Mejia is a 33 y.o. very pleasant female patient who presents with the following:  Has bitemporal headaches in January and went to eye doctor thinking she may need glasses but did not.  He put her on cipro and an eyedrop and her headaches went away.  She had itchy eyes at the time and it resolved.  Over the past two weeks she has resumed having headaches that she describes as daily, all day long associated with discomfort in her periorbital area.  No nasal congestion or drainage.  No discharge. No post nasal drip.  No fever or chills.. No cough, coryza.  No sore throat.  Has pain in both temples when she opens her mouth.  No improvement with over the counter medications or other home remedies. Denies other complaint or health concern today.   Patient Active Problem List   Diagnosis Date Noted  . Left breast mass 06/10/2013  . Pregnancy, supervision of normal 03/28/2010  . ABNORMAL MATERNAL GLUCOSE TOLERANCE ANTEPARTUM 03/14/2010    History reviewed. No pertinent past medical history.  Past Surgical History  Procedure Laterality Date  . Appendectomy      [place]formed in [place] age 89    History  Substance Use Topics  . Smoking status: Never Smoker   . Smokeless tobacco: Not on file  . Alcohol Use: Yes     Comment: OCC    Family History  Problem Relation Age of Onset  . Diabetes Mother     No Known Allergies  Medication list has been reviewed and updated.  Current Outpatient Prescriptions on File Prior to Visit  Medication Sig Dispense Refill  . ibuprofen (ADVIL,MOTRIN) 600 MG tablet Take 1 tablet (600 mg total) by mouth every 6 (six) hours as needed.  30 tablet  0  . levonorgestrel (MIRENA) 20  MCG/24HR IUD 1 each by Intrauterine route once.      . ondansetron (ZOFRAN-ODT) 8 MG disintegrating tablet Take 1 tablet (8 mg total) by mouth every 8 (eight) hours as needed for nausea.  30 tablet  0   No current facility-administered medications on file prior to visit.    Review of Systems:  As per HPI, otherwise negative.    Physical Examination: Filed Vitals:   08/09/13 1636  BP: 100/64  Pulse: 62  Temp: 97.8 F (36.6 C)  Resp: 18   Filed Vitals:   08/09/13 1636  Height: 5\' 1"  (1.549 m)  Weight: 133 lb 3.2 oz (60.419 kg)   Body mass index is 25.18 kg/(m^2). Ideal Body Weight: Weight in (lb) to have BMI = 25: 132   GEN: WDWN, NAD, Non-toxic, Alert & Oriented x 3 HEENT: Atraumatic, Normocephalic. TM negative.  TMJ intact and not tender.  PRRERLA EOMI CN 2-12 intact Ears and Nose: No external deformity.  Tender temples. EXTR: No clubbing/cyanosis/edema NEURO: Normal gait.  PSYCH: Normally interactive. Conversant. Not depressed or anxious appearing.  Calm demeanor.    Assessment and Plan: Tension headaches fioricet  Signed,  Phillips OdorJeffery Anderson, MD

## 2013-08-09 NOTE — Patient Instructions (Signed)
Cefalea tensional  (Tension Headache)  Una cefalea tensional es una sensacin de dolor y opresin en la frente y los lados de la cabeza. El dolor puede ser sordo o puede sentirse que comprime (constrictivo). Este es el tipo ms comn de dolor de Turkmenistancabeza. Generalmente no se asocian con nuseas o vmitos y no empeoran con la actividad fsica. Pueden durar desde 30 minutos a varios das.  CAUSAS  Se desconoce la causa exacta, pero puede ser causada por las sustancias qumicas y las hormonas del cerebro que producen dolor. Suelen comenzar despus de una situacin de estrs, ansiedad o por depresin. Otros desencadenantes pueden ser:   El alcohol.  Cafena (demasiada o abstinencia).  Infecciones respiratorias (resfriados, gripes, sinusitis).  Problemas dentales o apretar los dientes.  Fatiga.  Mantener la cabeza y el cuello en una posicin demasiado tiempo mientras utiliza un ordenador. SNTOMAS   Presin alrededor de la cabeza.   Dolor "sordo" en la cabeza.   Dolor que siente sobre la frente y los lados de la cabeza.   Sensibilidad en los msculos de la cabeza, del cuello y de los hombros. DIAGNSTICO  El diagnstico se realiza en base a:   Sntomas.   Examen fsico.   Neomia DearUna TC (tomografa computada) o resonancia magntica de la cabeza. Se pueden pedir estas pruebas, si los sntomas son severos o inusuales. TRATAMIENTO  Le recetarn medicamentos que ayudan a [employer]bury Automotive Groupaliviar los sntomas.  INSTRUCCIONES PARA EL CUIDADO EN EL HOGAR   Slo tome medicamentos de venta libre o recetados para Primary school teachercalmar el dolor o Environmental health practitionerel malestar, segn las indicaciones de su mdico.   Cuando sienta dolor de cabeza acustese en un cuarto oscuro y tranquilo.   Lleve un registro diario para Financial risk analystaveriguar lo que Southern Companypuede desencadenar los dolores de Turkmenistancabeza. Por ejemplo, escriba:  Lo que come y bebe.  Cunto tiempo duerme.  Todo cambio en la dieta o medicamentos.  Trate con masajes u otras tcnicas de relajacin.    Pueden utilizarse bolsas de hielo o calor aplicadas a la cabeza o al cuello. selos 3 a 4 veces por da de 15 a 20 minutos por vez, o como sea necesario.   Limite las situaciones de estrs.   Sintese con la espalda recta y no tense los msculos.   Si fuma, deje de hacerlo.  Limite el consumo de bebidas alcohlicas.  Consuma menos cantidad de cafena o deje de tomarla.  Coma y haga ejercicios regularmente.  Duerma entre 7 y 9 horas o como lo indique su mdico.  Evite el uso excesivo de medicamentos para Investment banker, operationalel dolor como el dolor recurrente de cabeza que pueden ocurrir.  SOLICITE ATENCIN MDICA SI:   Tiene problemas con los Arboriculturistmedicamentos que le recetaron.  El medicamento no le hace efecto.  El dolor de cabeza que senta habitualmente es diferente.  Tiene nuseas o vmitos. SOLICITE ATENCIN MDICA DE INMEDIATO SI:   El dolor se hace cada vez ms intenso.  Tiene fiebre.  Presenta rigidez en el cuello.  Sufre prdida de la visin.  Presenta debilidad muscular o prdida del control muscular.  Pierde equilibrio o tiene problemas para Advertising account plannercaminar.  Sufre mareos o se desmaya.  Tiene sntomas graves que son diferentes a los primeros sntomas. ASEGRESE DE QUE:   Comprende estas instrucciones.  Controlar su enfermedad.  Solicitar ayuda de inmediato si no mejora o si empeora. Document Released: 11/14/2004 Document Revised: 04/29/2011 [facility] Patient Information 2015 [place]ExitCare, [place]LL[place]. This information is not intended to replace advice given to you by  your health care provider. Make sure you discuss any questions you have with your health care provider.  

## 2015-02-19 ENCOUNTER — Encounter (HOSPITAL_COMMUNITY): Payer: Self-pay | Admitting: Emergency Medicine

## 2015-02-19 ENCOUNTER — Emergency Department (INDEPENDENT_AMBULATORY_CARE_PROVIDER_SITE_OTHER)
Admission: EM | Admit: 2015-02-19 | Discharge: 2015-02-19 | Disposition: A | Discharge status: Home / Self Care | Payer: Self-pay | Source: Home / Self Care | Attending: Internal Medicine | Admitting: Internal Medicine

## 2015-02-19 DIAGNOSIS — R69 Illness, unspecified: Principal | ICD-10-CM

## 2015-02-19 DIAGNOSIS — J111 Influenza due to unidentified influenza virus with other respiratory manifestations: Secondary | ICD-10-CM

## 2015-02-19 MED ORDER — PREDNISONE 50 MG PO TABS: 50.0000 mg | ORAL_TABLET | Freq: Every day | ORAL | Status: DC

## 2015-02-19 MED ORDER — BENZONATATE 200 MG PO CAPS: 200.0000 mg | ORAL_CAPSULE | Freq: Three times a day (TID) | ORAL | Status: DC | PRN

## 2015-02-19 NOTE — ED Notes (Signed)
Complains of cough, laryngitis, no runny nose, reports yellow/green phlegm.  Denies this is sore throat.  Onset of symptoms Tuesday 12/27

## 2015-02-19 NOTE — Discharge Instructions (Signed)
Prescriptions for prednisone (for cough, congestion) and benzonatate (for cough) [facility] [address]. Recheck for new fever >100.5, increasing phlegm, or if not starting to improve in a few more days. Anticipate gradual improvement in cough, congestion, and hoarseness, over the next 2-3 weeks.

## 2015-02-19 NOTE — ED Provider Notes (Signed)
CSN: [id number]     Arrival date & time 02/19/15  1856 History   First MD Initiated Contact with Patient 02/19/15 1941     Chief Complaint  Patient presents with  . Cough   HPI  Patient is a 35 year old lady who presents with a five-day history of hoarseness, with fever, achiness, cough and sore throat. No GI symptoms reported.  History reviewed. No pertinent past medical history. Past Surgical History  Procedure Laterality Date  . Appendectomy      Performed in [place] at age 89   Family History  Problem Relation Age of Onset  . Diabetes Mother    Social History  Substance Use Topics  . Smoking status: Never Smoker   . Smokeless tobacco: None  . Alcohol Use: Yes     Comment: OCC   OB History    Gravida Para Term Preterm AB TAB SAB Ectopic Multiple Living   2 2 2  0 0     1     Review of Systems  All other systems reviewed and are negative.   Allergies  Review of patient's allergies indicates no known allergies.  Home Medications   Prior to Admission medications   Medication Sig Start Date End Date Taking? Authorizing Provider         ibuprofen (ADVIL,MOTRIN) 600 MG tablet Take 1 tablet (600 mg total) by mouth every 6 (six) hours as needed. 04/03/13   Antony MaduraKelly Humes, PA-C  levonorgestrel (MIRENA) 20 MCG/24HR IUD 1 each by Intrauterine route once.    Historical Provider, MD  ondansetron (ZOFRAN-ODT) 8 MG disintegrating tablet Take 1 tablet (8 mg total) by mouth every 8 (eight) hours as needed for nausea. 07/01/13   Carmelina DaneJeffery S Anderson, MD           BP 103/70 mmHg  Pulse 71  Temp(Src) 98.6 F (37 C) (Oral)  Resp 16  SpO2 97%   Physical Exam  Constitutional: She is oriented to person, place, and time.  Alert, nicely groomed Voice is very hoarse Looks ill but not toxic, sitting up on the end of the exam table  HENT:  Head: Atraumatic.  Bilateral TMs are mildly dull, no erythema Moderate to marked nasal congestion bilaterally Throat is slightly red with prominent  tonsils  Eyes:  Conjugate gaze, no eye redness/drainage  Neck: Neck supple.  Cardiovascular: Normal rate and regular rhythm.   Pulmonary/Chest: No respiratory distress. She has no wheezes. She has no rales.  Lungs clear, symmetric breath sounds  Abdominal: She exhibits no distension.  Musculoskeletal: Normal range of motion.  No leg swelling  Neurological: She is alert and oriented to person, place, and time.  Skin: Skin is warm and dry.  No cyanosis  Nursing note and vitals reviewed.   ED Course  Procedures (including critical care time)  none  MDM   1. Influenza-like illness    Discharge Medication List as of 02/19/2015  7:53 PM    START taking these medications   Details  benzonatate (TESSALON) 200 MG capsule Take 1 capsule (200 mg total) by mouth 3 (three) times daily as needed for cough., Starting 02/19/2015, Until Discontinued, Normal    predniSONE (DELTASONE) 50 MG tablet Take 1 tablet (50 mg total) by mouth daily., Starting 02/19/2015, Until Discontinued, Normal       Recheck for new fever >100.5, increasing phlegm, or if not starting to improve in a few days. Anticipate gradual improvement in cough/congestion/hoarseness over the next 2-3 weeks.    Mayra ReelLaura W  Dayton Scrape, MD 02/20/15 1131

## 2015-06-24 ENCOUNTER — Ambulatory Visit (INDEPENDENT_AMBULATORY_CARE_PROVIDER_SITE_OTHER): Payer: Managed Care, Other (non HMO) | Admitting: Internal Medicine

## 2015-06-24 ENCOUNTER — Other Ambulatory Visit: Payer: Self-pay | Admitting: Internal Medicine

## 2015-06-24 VITALS — BP 104/62 | HR 76 | Temp 98.4°F | Resp 16 | Ht 61.0 in | Wt 129.0 lb

## 2015-06-24 DIAGNOSIS — J301 Allergic rhinitis due to pollen: Secondary | ICD-10-CM | POA: Diagnosis not present

## 2015-06-24 MED ORDER — PREDNISONE 20 MG PO TABS: ORAL_TABLET | ORAL | Status: DC

## 2015-06-24 MED ORDER — FLUTICASONE PROPIONATE 50 MCG/ACT NA SUSP: NASAL | Status: DC

## 2015-06-24 NOTE — Progress Notes (Signed)
Subjective:  By signing my name below, I, Raven Small, attest that this documentation has been prepared under the direction and in the presence of Ellamae Sia, MD.  Electronically Signed: Andrew Au, ED Scribe. 06/24/2015. 11:22 AM.   Patient ID: Brittany Mejia, female    DOB: [birth year], 35 y.o.   MRN: [id number]  HPI Chief Complaint  Patient presents with  . other    seasonal allergies    HPI Comments: Brittany Mejia is a 35 y.o. female who presents to the [facility] complaining of cough and sore throat  Pt states symptoms started 5-6 days ago with cough, sore throat, nasal congestion, sneezing, rhinorrhea, itch watery eyes and itchy ears. Pt suspects theses symptoms are due to allergies. She has taken OTC decongestant for the past 3 days without relief to symptoms. She denies fever and chills.   Patient Active Problem List   Diagnosis Date Noted  . Left breast mass 06/10/2013  . Pregnancy, supervision of normal 03/28/2010  . ABNORMAL MATERNAL GLUCOSE TOLERANCE ANTEPARTUM 03/14/2010   No past medical history on file. Past Surgical History  Procedure Laterality Date  . Appendectomy      Performed in [place] at age 61   No Known Allergies Prior to Admission medications   Medication Sig Start Date End Date Taking? Authorizing Provider  fluticasone (FLONASE) 50 MCG/ACT nasal spray Place into both nostrils daily.   Yes Historical Provider, MD  levonorgestrel (MIRENA) 20 MCG/24HR IUD 1 each by Intrauterine route once.   Yes Historical Provider, MD  benzonatate (TESSALON) 200 MG capsule Take 1 capsule (200 mg total) by mouth 3 (three) times daily as needed for cough. Patient not taking: Reported on 06/24/2015 02/19/15   Eustace Moore, MD  ibuprofen (ADVIL,MOTRIN) 600 MG tablet Take 1 tablet (600 mg total) by mouth every 6 (six) hours as needed. Patient not taking: Reported on 06/24/2015 04/03/13   Antony Madura, PA-C  ondansetron (ZOFRAN-ODT) 8 MG  disintegrating tablet Take 1 tablet (8 mg total) by mouth every 8 (eight) hours as needed for nausea. Patient not taking: Reported on 06/24/2015 07/01/13   Carmelina Dane, MD  predniSONE (DELTASONE) 50 MG tablet Take 1 tablet (50 mg total) by mouth daily. Patient not taking: Reported on 06/24/2015 02/19/15   Eustace Moore, MD   Social History   Social History  . Marital Status: Married    Spouse Name: N/A  . Number of Children: N/A  . Years of Education: N/A   Occupational History  .     Social History Main Topics  . Smoking status: Never Smoker   . Smokeless tobacco: Not on file  . Alcohol Use: Yes     Comment: OCC  . Drug Use: No  . Sexual Activity: Yes    Birth Control/ Protection: IUD   Other Topics Concern  . Not on file   Social History Narrative   Review of Systems  Constitutional: Negative for fever, chills and diaphoresis.  HENT: Positive for congestion, rhinorrhea, sneezing and sore throat. Negative for ear discharge and ear pain.   Eyes: Positive for discharge, redness and itching.  Respiratory: Positive for cough.   Allergic/Immunologic: Positive for environmental allergies.    Objective:   Physical Exam  Constitutional: She is oriented to person, place, and time. She appears well-developed and well-nourished. No distress.  HENT:  Head: Normocephalic and atraumatic.  Mouth/Throat: Uvula is midline, oropharynx is clear and moist and mucous membranes are normal. No oropharyngeal  exudate or posterior oropharyngeal erythema.  TMs dull  Nares are boggy  Eyes: EOM are normal. Pupils are equal, round, and reactive to light. Right conjunctiva is injected. Left conjunctiva is injected.  Neck: Normal range of motion. Neck supple.  Cardiovascular: Normal rate, regular rhythm and normal heart sounds.   No murmur heard. Pulmonary/Chest: Effort normal and breath sounds normal. She has no wheezes. She has no rales.  Musculoskeletal: Normal range of motion.    Lymphadenopathy:    She has no cervical adenopathy.  Neurological: She is alert and oriented to person, place, and time.  Skin: Skin is warm and dry.  Psychiatric: She has a normal mood and affect. Her behavior is normal.  Nursing note and vitals reviewed.   Filed Vitals:   06/24/15 1009  BP: 104/62  Pulse: 76  Temp: 98.4 F (36.9 C)  TempSrc: Oral  Resp: 16  Height: 5\' 1"  (1.549 m)  Weight: 129 lb (58.514 kg)  SpO2: 99%   Assessment & Plan:  Allergic rhinitis due to pollen    Meds ordered this encounter  Medications  . DISCONTD: fluticasone (FLONASE) 50 MCG/ACT nasal spray    Sig: Place into both nostrils daily.  . predniSONE (DELTASONE) 20 MG tablet    Sig: 4/3/3/2/2/1/1 single daily dose for 7 days    Dispense:  16 tablet    Refill:  0  . fluticasone (FLONASE) 50 MCG/ACT nasal spray    Sig: One spray each nostril twice a day during allergy season    Dispense:  16 g    Refill:  8    I have completed the patient encounter in its entirety as documented by the scribe, with editing by me where necessary. Vora Clover P. Merla Richesoolittle, M.D.

## 2015-06-24 NOTE — Patient Instructions (Signed)
     IF you received an x-ray today, you will receive an invoice from [facility]. Please contact [facility] at [phone] with questions or concerns regarding your invoice.   IF you received labwork today, you will receive an invoice from [facility]. Please contact [facility] at [phone] with questions or concerns regarding your invoice.   Our billing staff will not be able to assist you with questions regarding bills from these companies.  You will be contacted with the lab results as soon as they are available. The fastest way to get your results is to activate your My Chart account. Instructions are located on the last page of this paperwork. If you have not heard from us regarding the results in 2 weeks, please contact this office.      

## 2015-08-07 ENCOUNTER — Ambulatory Visit (INDEPENDENT_AMBULATORY_CARE_PROVIDER_SITE_OTHER): Payer: Managed Care, Other (non HMO) | Admitting: Family Medicine

## 2015-08-07 ENCOUNTER — Ambulatory Visit: Payer: Managed Care, Other (non HMO)

## 2015-08-07 VITALS — BP 102/66 | HR 68 | Temp 97.9°F | Resp 18 | Ht 61.0 in | Wt 129.0 lb

## 2015-08-07 DIAGNOSIS — J301 Allergic rhinitis due to pollen: Secondary | ICD-10-CM

## 2015-08-07 DIAGNOSIS — G44209 Tension-type headache, unspecified, not intractable: Secondary | ICD-10-CM

## 2015-08-07 MED ORDER — OLOPATADINE HCL 0.1 % OP SOLN: 1.0000 [drp] | Freq: Two times a day (BID) | OPHTHALMIC | Status: DC

## 2015-08-07 NOTE — Progress Notes (Signed)
[facility] [address] [place], [place] [address] [phone]  Date:  08/07/2015   Name:  Brittany Mejia   DOB:  May 05, 1980   MRN:  [id number]  PCP:  No PCP Per Patient    Chief Complaint: Sinusitis and Headache   History of Present Illness:  Brittany Mejia is a 35 y.o. very pleasant female patient who presents with the following: Congestion x 5 days:  - similar symptoms to last month, prednisone dose pack - Uses flonase bid - IBU PRN - Within the last 5 days throat has been itching - Mild cough has also begun - Watery eyes, very itchy  NO sick contacts.  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 No outdoor activities due to exacerbation of symptoms.  - Takes allegra   Patient Active Problem List   Diagnosis Date Noted  . Left breast mass 06/10/2013  . Pregnancy, supervision of normal 03/28/2010  . ABNORMAL MATERNAL GLUCOSE TOLERANCE ANTEPARTUM 03/14/2010    History reviewed. No pertinent past medical history.  Past Surgical History  Procedure Laterality Date  . Appendectomy      Performed in [place] at age 31    Social History  Substance Use Topics  . Smoking status: Never Smoker   . Smokeless tobacco: None  . Alcohol Use: Yes     Comment: OCC    Family History  Problem Relation Age of Onset  . Diabetes Mother     No Known Allergies  Medication list has been reviewed and updated.  Current Outpatient Prescriptions on File Prior to Visit  Medication Sig Dispense Refill  . fluticasone (FLONASE) 50 MCG/ACT nasal spray One spray each nostril twice a day during allergy season 16 g 8  . ibuprofen (ADVIL,MOTRIN) 600 MG tablet Take 1 tablet (600 mg total) by mouth every 6 (six) hours as needed. 30 tablet 0  . levonorgestrel (MIRENA) 20 MCG/24HR IUD 1 each by Intrauterine route once. Reported on 08/07/2015    . benzonatate  (TESSALON) 200 MG capsule Take 1 capsule (200 mg total) by mouth 3 (three) times daily as needed for cough. (Patient not taking: Reported on 06/24/2015) 30 capsule 0  . ondansetron (ZOFRAN-ODT) 8 MG disintegrating tablet Take 1 tablet (8 mg total) by mouth every 8 (eight) hours as needed for nausea. (Patient not taking: Reported on 06/24/2015) 30 tablet 0  . predniSONE (DELTASONE) 20 MG tablet 4/3/3/2/2/1/1 single daily dose for 7 days (Patient not taking: Reported on 08/07/2015) 16 tablet 0   No current facility-administered medications on file prior to visit.    Review of Systems:  Review of Systems  Constitutional: Negative for fever, chills and malaise/fatigue.  HENT: Positive for congestion and sore throat (very mild). Negative for nosebleeds and tinnitus.   Respiratory: Positive for cough (mild). Negative for hemoptysis, sputum production, shortness of breath and wheezing.   Cardiovascular: Negative for chest pain.  Gastrointestinal: Negative for nausea, vomiting, abdominal pain, diarrhea and constipation.  Musculoskeletal: Negative for myalgias.  Skin: Positive for itching (eyes). Negative for rash.  Neurological: Negative for dizziness and headaches.    Physical Examination: Filed Vitals:   08/07/15 1001  BP: 102/66  Pulse: 68  Temp: 97.9 F (36.6 C)  Resp: 18   Filed Vitals:   08/07/15 1001  Height:  (1.549 m)  Weight: 129 lb (58.514 kg)   Body mass  index is 24.39 kg/(m^2). Ideal Body Weight: Weight in (lb) to have BMI = 25: 132  Physical Exam  Constitutional: She is oriented to person, place, and time. She appears well-developed and well-nourished. No distress.  HENT:  Head: Normocephalic and atraumatic.  Mouth/Throat: Uvula is midline, oropharynx is clear and moist and mucous membranes are normal. No oropharyngeal exudate or posterior oropharyngeal erythema.  TMs full without air-fluid level. + LR Nares are boggy  Eyes: EOM are normal. Pupils are equal, round,  and reactive to light. Right conjunctiva is injected. Left conjunctiva is injected.  Neck: Normal range of motion. Neck supple.  Cardiovascular: Normal rate, regular rhythm and normal heart sounds.  No murmur heard. Pulmonary/Chest: Effort normal and breath sounds normal. She has no wheezes. She has no rales.  Musculoskeletal: Normal range of motion.  Lymphadenopathy:   She has no cervical adenopathy.  Neurological: She is alert and oriented to person, place, and time.  Skin: Skin is warm and dry.  Psychiatric: She has a normal mood and affect. Her behavior is normal.  Nursing note and vitals reviewed  Assessment and Plan: Allergic rhinitis with tension headache, currently allergic conjunctivitis is the worst  - Add Nasal saline - Add patanol eye drops.  - continue allegra and flonase - f/u as needed. Start meds next year 2 weeks prior to start of normal symtpoms.   Signed Guinevere ScarletBlake Shakeerah Gradel, MD

## 2015-08-07 NOTE — Patient Instructions (Addendum)
IF you received an x-ray today, you will receive an invoice from [facility]. Please contact [facility] at [phone] with questions or concerns regarding your invoice.   IF you received labwork today, you will receive an invoice from [facility]. Please contact [facility] at [phone] with questions or concerns regarding your invoice.   Our billing staff will not be able to assist you with questions regarding bills from these companies.  You will be contacted with the lab results as soon as they are available. The fastest way to get your results is to activate your My Chart account. Instructions are located on the last page of this paperwork. If you have not heard from us regarding the results in 2 weeks, please contact this office.    Olopatadine eye solution What is this medicine? OLOPATADINE (oh loe pa TA deen) drops are used in the eye to treat symptoms caused by allergies. This medicine may be used for other purposes; ask your health care provider or pharmacist if you have questions. What should I tell my health care provider before I take this medicine? They need to know if you have any of these conditions: -wear contact lenses -an unusual or allergic reaction to olopatadine, other medicines, foods, dyes, or preservatives -pregnant or trying to get pregnant -breast-feeding How should I use this medicine? This medicine is only for use in the eye. Do not take by mouth. Follow the directions on the prescription label. Wash hands before and after use. Tilt the head back slightly and pull down the lower lid with the index finger to form a pouch. Try not to touch the tip of the dropper to your eye, fingertips, or any other surface. Squeeze the prescribed number of drops into the pouch. Close the eye gently to spread the drops. Your vision may blur for a few minutes. Use your doses at regular intervals. Do not use your medicine more often  than directed. Talk to your pediatrician regarding the use of this medicine in children. While this drug may be prescribed for children as young as 37 years of age for selected conditions, precautions do apply. Overdosage: If you think you have taken too much of this medicine contact a poison control center or emergency room at once. NOTE: This medicine is only for you. Do not share this medicine with others. What if I miss a dose? If you miss a dose, use it as soon as you can. If it is almost time for your next dose, use only that dose. Do not use double or extra doses. What may interact with this medicine? Interactions are not expected. Do not use any other eye products without telling your doctor or health care professional. This list may not describe all possible interactions. Give your health care provider a list of all the medicines, herbs, non-prescription drugs, or dietary supplements you use. Also tell them if you smoke, drink alcohol, or use illegal drugs. Some items may interact with your medicine. What should I watch for while using this medicine? Tell your doctor or healthcare professional if your symptoms do not start to get better or if they get worse. Stop using this medicine if your eyes get swollen, painful, or have a discharge, and see your doctor or health care professional as soon as you can. Do not wear contact lenses if your eyes are red. This medicine should not be used to treat irritation that is caused by contact lenses. Remove soft contact  lenses before using this medicine. Contact lenses may be inserted 10 minutes after putting the medicine in the eye. What side effects may I notice from receiving this medicine? Side effects that you should report to your doctor or health care professional as soon as possible: -allergic reactions like skin rash, itching or hives, swelling of the face, lips, or tongue) -eye pain, swelling, or increased redness Side effects that usually do  not require medical attention (report to your doctor or health care professional if they continue or are bothersome): -burning, discomfort, stinging, or tearing immediately after use -dry eyes -headache -runny or stuffy nose This list may not describe all possible side effects. Call your doctor for medical advice about side effects. You may report side effects to FDA at 1-800-FDA-1088. Where should I keep my medicine? Keep out of reach of children. Store between 4 and 25 degrees C (39 and 77 degrees F). Keep container tightly closed when not in use. Protect from light. Throw away any unused medicine after the expiration date. NOTE: This sheet is a summary. It may not cover all possible information. If you have questions about this medicine, talk to your doctor, pharmacist, or health care provider.    2016, Elsevier/Gold Standard. (2013-11-10 14:11:58) Allergic Rhinitis Allergic rhinitis is when the mucous membranes in the nose respond to allergens. Allergens are particles in the air that cause your body to have an allergic reaction. This causes you to release allergic antibodies. Through a chain of events, these eventually cause you to release histamine into the blood stream. Although meant to protect the body, it is this release of histamine that causes your discomfort, such as frequent sneezing, congestion, and an itchy, runny nose.  CAUSES Seasonal allergic rhinitis (hay fever) is caused by pollen allergens that may come from grasses, trees, and weeds. Year-round allergic rhinitis (perennial allergic rhinitis) is caused by allergens such as house dust mites, pet dander, and mold spores. SYMPTOMS  Nasal stuffiness (congestion).  Itchy, runny nose with sneezing and tearing of the eyes. DIAGNOSIS Your health care provider can help you determine the allergen or allergens that trigger your symptoms. If you and your health care provider are unable to determine the allergen, skin or blood testing  may be used. Your health care provider will diagnose your condition after taking your health history and performing a physical exam. Your health care provider may assess you for other related conditions, such as asthma, pink eye, or an ear infection. TREATMENT Allergic rhinitis does not have a cure, but it can be controlled by:  Medicines that block allergy symptoms. These may include allergy shots, nasal sprays, and oral antihistamines.  Avoiding the allergen. Hay fever may often be treated with antihistamines in pill or nasal spray forms. Antihistamines block the effects of histamine. There are over-the-counter medicines that may help with nasal congestion and swelling around the eyes. Check with your health care provider before taking or giving this medicine. If avoiding the allergen or the medicine prescribed do not work, there are many new medicines your health care provider can prescribe. Stronger medicine may be used if initial measures are ineffective. Desensitizing injections can be used if medicine and avoidance does not work. Desensitization is when a patient is given ongoing shots until the body becomes less sensitive to the allergen. Make sure you follow up with your health care provider if problems continue. HOME CARE INSTRUCTIONS It is not possible to completely avoid allergens, but you can reduce your symptoms by taking  steps to limit your exposure to them. It helps to know exactly what you are allergic to so that you can avoid your specific triggers. SEEK MEDICAL CARE IF:  You have a fever.  You develop a cough that does not stop easily (persistent).  You have shortness of breath.  You start wheezing.  Symptoms interfere with normal daily activities.   This information is not intended to replace advice given to you by your health care provider. Make sure you discuss any questions you have with your health care provider.   Document Released: 10/30/2000 Document Revised:  02/25/2014 Document Reviewed: 10/12/2012 Elsevier Interactive Patient Education [employer]

## 2015-08-23 ENCOUNTER — Encounter: Payer: Self-pay | Admitting: Gynecology

## 2015-08-23 ENCOUNTER — Telehealth: Payer: Self-pay | Admitting: *Deleted

## 2015-08-23 ENCOUNTER — Ambulatory Visit (INDEPENDENT_AMBULATORY_CARE_PROVIDER_SITE_OTHER): Payer: Managed Care, Other (non HMO) | Admitting: Gynecology

## 2015-08-23 VITALS — BP 102/70

## 2015-08-23 DIAGNOSIS — N63 Unspecified lump in breast: Secondary | ICD-10-CM | POA: Diagnosis not present

## 2015-08-23 DIAGNOSIS — N6320 Unspecified lump in the left breast, unspecified quadrant: Secondary | ICD-10-CM

## 2015-08-23 NOTE — Progress Notes (Signed)
   HPI: Patient 35 year old was seen in the office as a new patient back in April 2015. Patient was complaining of left breast tenderness and nodular area noted. She denies any family history of any breast cancer. She denies any recent trauma or injury. She denies any nipple discharge. Patient with Mirena IUD haven't been placed in 2012. During that office visit she was found to have the following on breast exam:  a left breast mass located 3 fingerbreadths from the areolar region at the 2:00 position measuring approximately 1-1/2 cm to 2 cm in size mobile and nontender. Patient had no supraclavicular or axillary lymphadenopathy and contralateral breast was normal.   She subsequently was sent for a diagnostic mammogram and ultrasound the following was reported: FINDINGS: There are no discrete masses, areas of architectural distortion, areas of significant asymmetry or calcifications.  Mammographic images were processed with CAD.  On physical exam, there is a smooth mobile nodular area in the 2 o'clock position of the left breast approximately 8 cm from the nipple. The overall texture of the upper outer left breast is nodular.  Ultrasound is performed, showing normal fibroglandular tissue. The entire upper outer left breast was interrogated with specific attention to the more focal nodular area. No mass or cyst is seen.  IMPRESSION: No evidence of malignancy. Normal exam.  RECOMMENDATION: Screening mammogram at age 35 unless there are persistent or intervening clinical concerns.   Patient concern that this area has kind bigger and more tender now is she's here for examination of her breasts.  ROS: A ROS was performed and pertinent positives and negatives are included in the history.  GENERAL: No fevers or chills. HEENT: No change in vision, no earache, sore throat or sinus congestion. NECK: No pain or stiffness. CARDIOVASCULAR: No chest pain or pressure. No palpitations.  PULMONARY: No shortness of breath, cough or wheeze. GASTROINTESTINAL: No abdominal pain, nausea, vomiting or diarrhea, melena or bright red blood per rectum. GENITOURINARY: No urinary frequency, urgency, hesitancy or dysuria. MUSCULOSKELETAL: No joint or muscle pain, no back pain, no recent trauma. DERMATOLOGIC: No rash, no itching, no lesions. ENDOCRINE: No polyuria, polydipsia, no heat or cold intolerance. No recent change in weight. HEMATOLOGICAL: No anemia or easy bruising or bleeding. NEUROLOGIC: No headache, seizures, numbness, tingling or weakness. PSYCHIATRIC: No depression, no loss of interest in normal activity or change in sleep pattern.   PE: Blood pressure 120/70 Gen. appearance well-developed well-nourished female somewhat anxious because of the above-mentioned concerns. Her breasts were examined sitting supine position the following was noted: Physical Exam  Pulmonary/Chest:    There was no axillary or supraclavicular lymphadenopathy on the left breast. Right breast no palpable masses or tenderness. No supra clavicular axillary lymphadenopathy. No skin discoloration or nipple inversion on either breast.   Assessment Plan: #1 left breast nodular area similar to area that was evaluated in 2015. A diagnostic mammogram and ultrasound for follow-up we'll order today. #2 patient to return back again of this month to change her Mirena IUD which is overdue.    Greater than 50% of time was spent in counseling and coordinating care of this patient.   Time of consultation: 15   Minutes.

## 2015-08-23 NOTE — Telephone Encounter (Signed)
Orders placed at the breast center they will contact pt to schedule. 

## 2015-08-23 NOTE — Telephone Encounter (Signed)
-----   Message from Ok EdwardsJuan H Fernandez, MD sent at 08/23/2015  2:18 PM EDT ----- Please schedule a diagnostic mammogram and ultrasound for this patient with a breast mass on the left at the 2:00 position 2 fingerbreadths from the areolar region. Same area as 2 years ago appears to be bigger

## 2015-08-28 ENCOUNTER — Telehealth: Payer: Self-pay | Admitting: Gynecology

## 2015-08-28 NOTE — Telephone Encounter (Signed)
08/28/15-Pt was advised today that her [employer] ins will cover the Mirena for contraception at 100%, no copay. Per [id number]

## 2015-08-29 NOTE — Telephone Encounter (Signed)
Appointment on 09/01/15 @ 2:20pm claudia informed patient.

## 2015-09-01 ENCOUNTER — Ambulatory Visit
Admission: RE | Admit: 2015-09-01 | Discharge: 2015-09-01 | Disposition: A | Discharge status: Home / Self Care | Payer: Managed Care, Other (non HMO) | Source: Ambulatory Visit | Attending: Gynecology | Admitting: Gynecology

## 2015-09-01 DIAGNOSIS — N6320 Unspecified lump in the left breast, unspecified quadrant: Secondary | ICD-10-CM

## 2015-09-14 ENCOUNTER — Ambulatory Visit (INDEPENDENT_AMBULATORY_CARE_PROVIDER_SITE_OTHER): Payer: Managed Care, Other (non HMO) | Admitting: Gynecology

## 2015-09-14 ENCOUNTER — Encounter: Payer: Self-pay | Admitting: Gynecology

## 2015-09-14 VITALS — BP 106/74

## 2015-09-14 DIAGNOSIS — Z30433 Encounter for removal and reinsertion of intrauterine contraceptive device: Secondary | ICD-10-CM | POA: Diagnosis not present

## 2015-09-14 DIAGNOSIS — Z975 Presence of (intrauterine) contraceptive device: Secondary | ICD-10-CM | POA: Insufficient documentation

## 2015-09-14 NOTE — Progress Notes (Signed)
   Patient is a 35 year old who has not been seen the office is 2015. Her Mirena IUD was expired this year and she is here for removal of expired IUD and replacement with a new one. She scheduled to return back next month for her annual exam which is overdue.                                                                    IUD procedure note       Patient presented to the office today for placement of Mirena IUD. The patient had previously been provided with literature information on this method of contraception. The risks benefits and pros and cons were discussed and all her questions were answered. She is fully aware that this form of contraception is 99% effective and is good for 5 years.  Pelvic exam: Bartholin urethra Skene glands: Within normal limits Vagina: No lesions or discharge Cervix: No lesions or discharge IUD string seen Uterus: Anteverted position Adnexa: No masses or tenderness Rectal exam: Not done  The cervix was cleansed with Betadine solution. A single-tooth tenaculum was placed on the anterior cervical lip. With a ring forcep the IUD string was grasped retrieved shown to the patient and discarded. The uterus sounded to 7-1/2 centimeter. The new Mirena IUD was shown to the patient and inserted in a sterile fashion. The IUD string was trimmed. The single-tooth tenaculum was removed. Patient was instructed to return back to the office in one month for follow up.       Mirena IUD placed 09/14/2015 good for 5 years lot number [id number]

## 2015-09-15 ENCOUNTER — Encounter: Payer: Self-pay | Admitting: Anesthesiology

## 2015-10-18 ENCOUNTER — Encounter: Payer: Managed Care, Other (non HMO) | Admitting: Gynecology

## 2015-10-18 DIAGNOSIS — Z0289 Encounter for other administrative examinations: Secondary | ICD-10-CM

## 2015-12-29 IMAGING — MG MM DIGITAL DIAGNOSTIC BILAT
5 series · 5 of 5 positions shown · non-contrast
Comparison: None.

CLINICAL DATA: Palpable abnormality in the left breast.

EXAM:
DIGITAL DIAGNOSTIC  BILATERAL MAMMOGRAM WITH CAD
ULTRASOUND LEFT BREAST

[R CC]
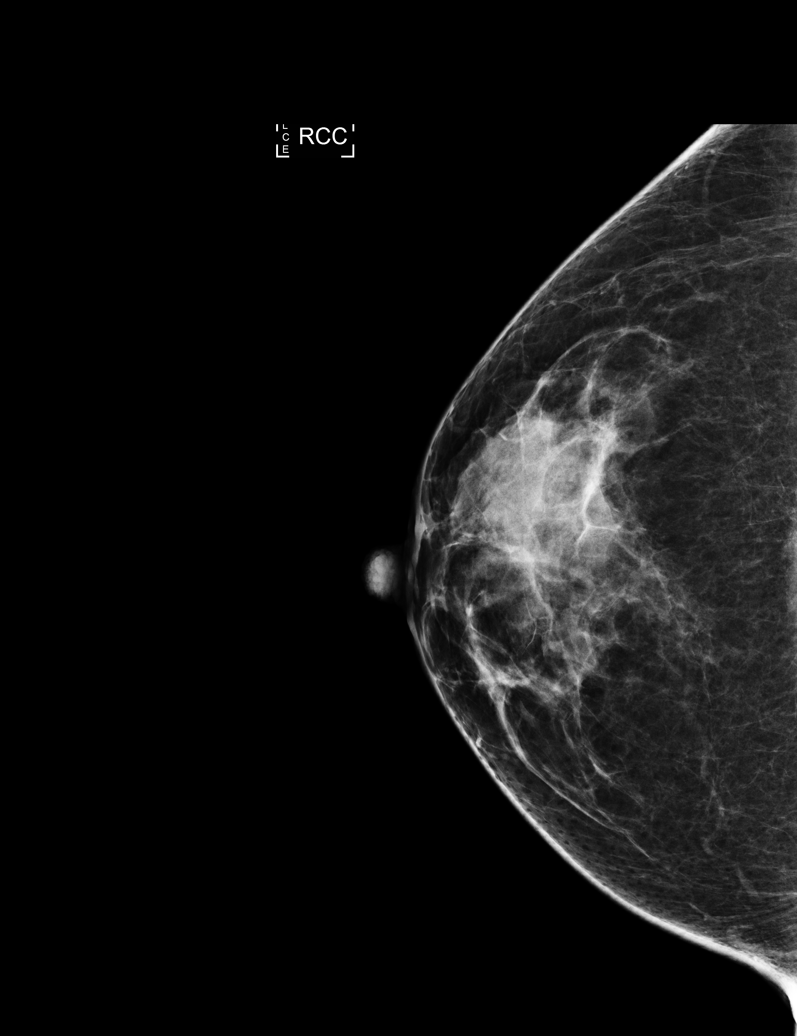

[L MLO]
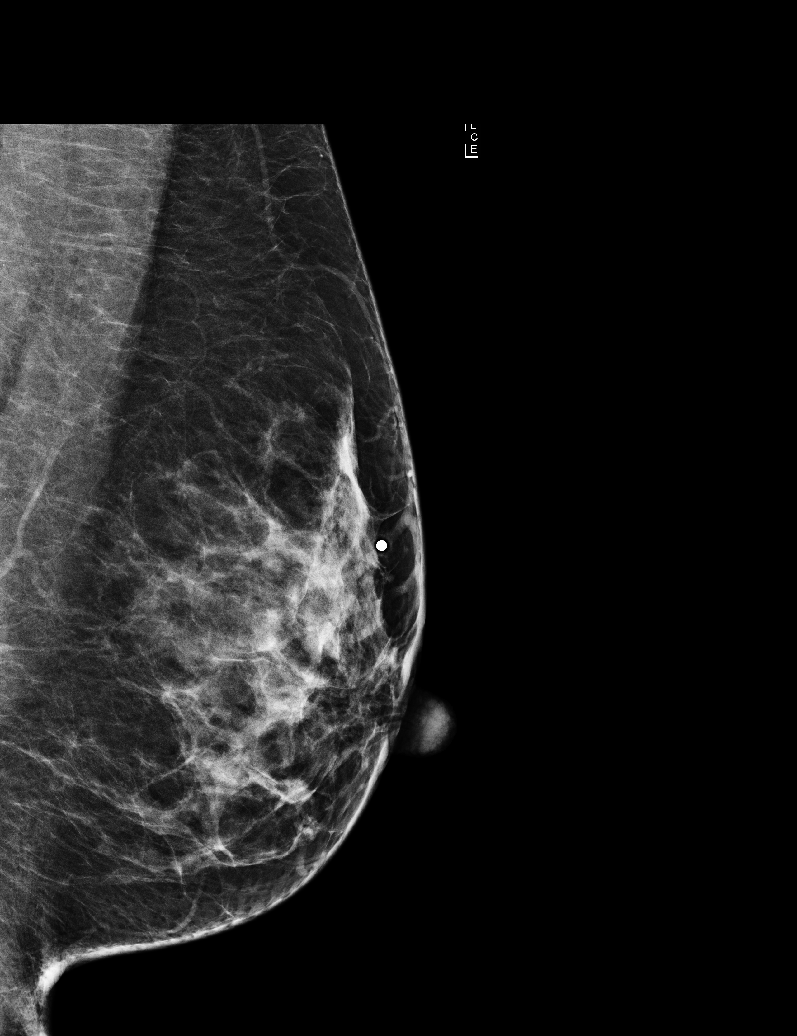

[L TAN]
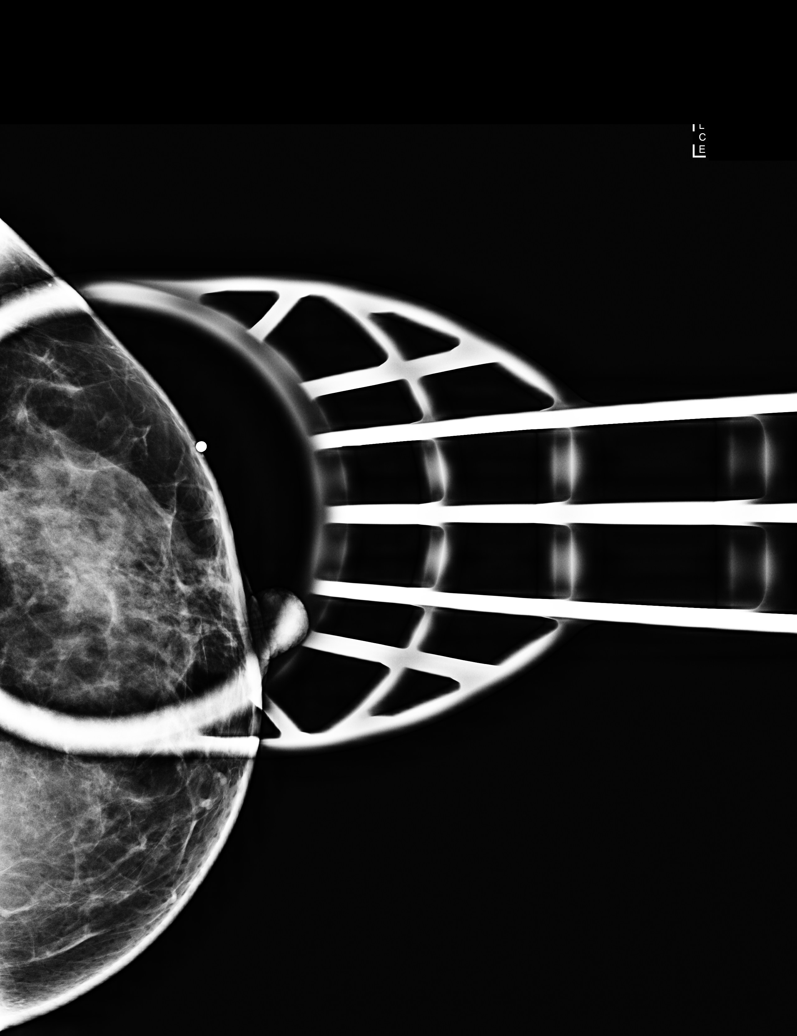

[R MLO]
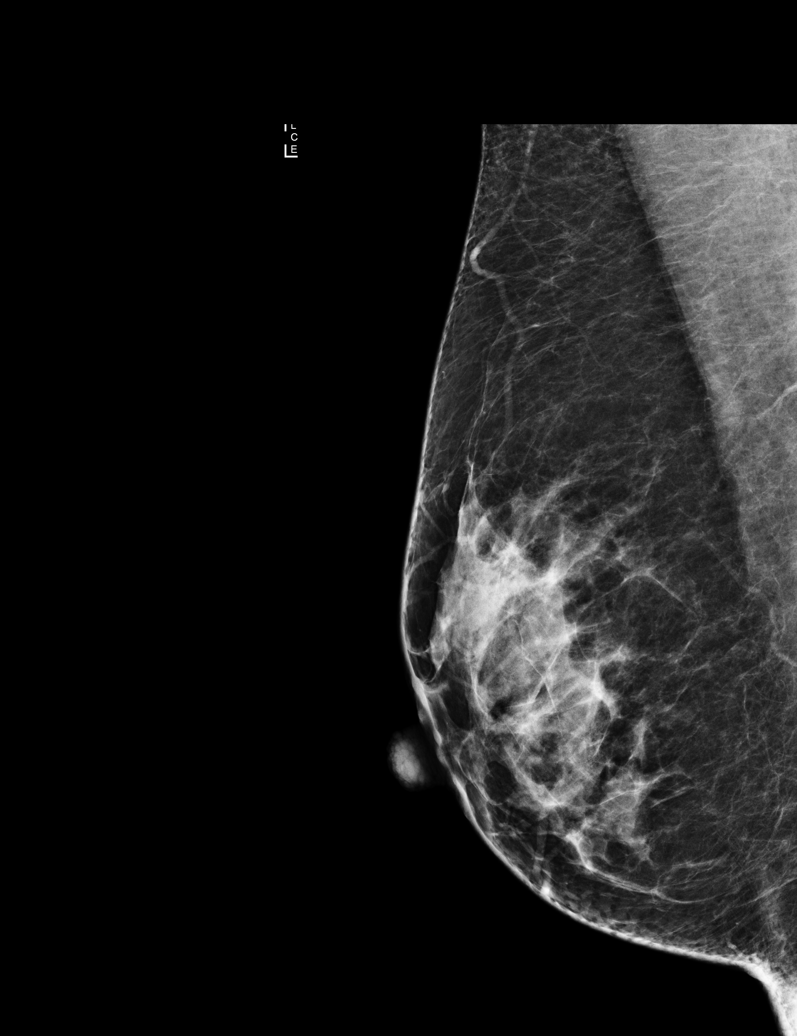

[L CC]
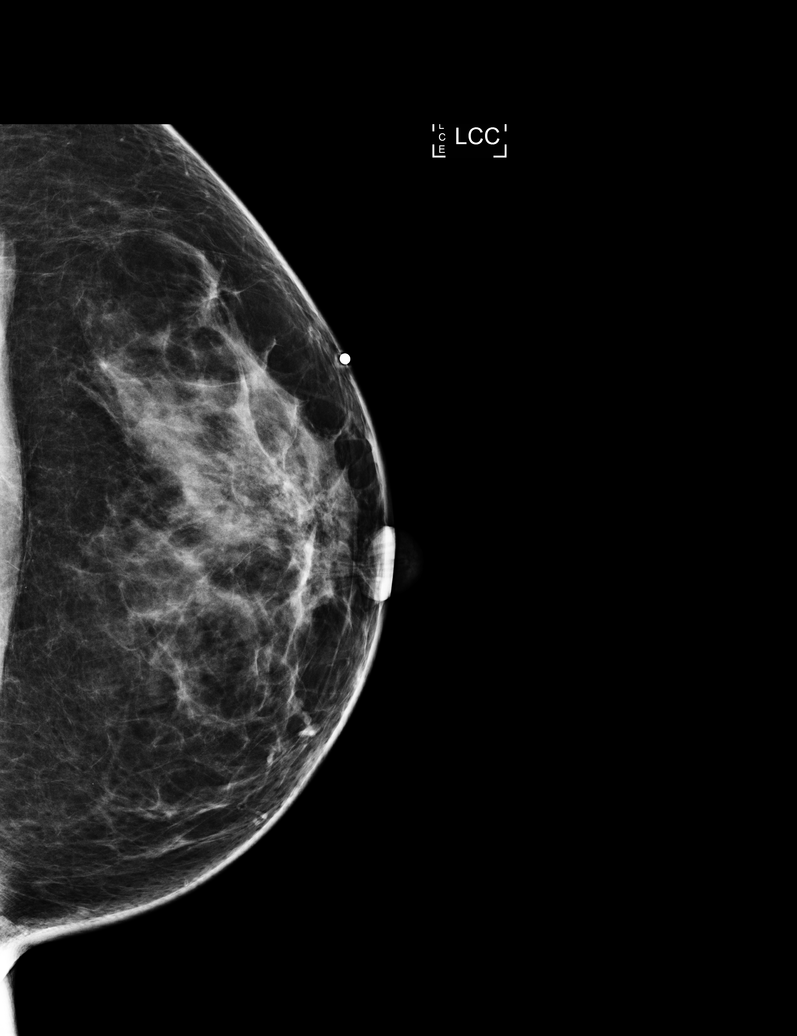

[5 of 5 positions shown; findings below may reference images not displayed]

ACR Breast Density Category c: The breast tissue is heterogeneously
dense, which may obscure small masses.
FINDINGS: There are no discrete masses, areas of architectural distortion,
areas of significant asymmetry or calcifications.

Mammographic images were processed with CAD.

On physical exam, there is a smooth mobile nodular area in the 2
o'clock position of the left breast approximately 8 cm from the
nipple. The overall texture of the upper outer left breast is
nodular.

Ultrasound is performed, showing normal fibroglandular tissue. The
entire upper outer left breast was interrogated with specific
attention to the more focal nodular area. No mass or cyst is seen.
IMPRESSION: No evidence of malignancy.  Normal exam.

RECOMMENDATION:
Screening mammogram at age 40 unless there are persistent or
intervening clinical concerns. (Code:K6-M-HQ7)

I have discussed the findings and recommendations with the patient.
Results were also provided in writing at the conclusion of the
visit. If applicable, a reminder letter will be sent to the patient
regarding the next appointment.

BI-RADS CATEGORY  1: Negative.

## 2016-07-03 ENCOUNTER — Encounter: Payer: Self-pay | Admitting: Gynecology

## 2017-09-15 ENCOUNTER — Encounter: Payer: Self-pay | Admitting: Family Medicine

## 2017-09-15 ENCOUNTER — Ambulatory Visit: Payer: Self-pay

## 2017-09-15 DIAGNOSIS — Z3201 Encounter for pregnancy test, result positive: Secondary | ICD-10-CM

## 2017-09-15 LAB — POCT PREGNANCY, URINE: Preg Test, Ur: POSITIVE — AB

## 2017-09-15 NOTE — Progress Notes (Signed)
Pt here today for a pregnancy test. Resulted +. Pt reports  LMP 07/28/17 EDD 05/04/2018 GA 926w0d. Verified medications with pt. Proof of pregnancy letter provided by front office to begin prenatal care.

## 2017-10-17 ENCOUNTER — Encounter: Payer: Self-pay | Admitting: Obstetrics and Gynecology

## 2017-10-17 ENCOUNTER — Ambulatory Visit (INDEPENDENT_AMBULATORY_CARE_PROVIDER_SITE_OTHER): Payer: Self-pay | Admitting: Obstetrics and Gynecology

## 2017-10-17 ENCOUNTER — Ambulatory Visit: Payer: Self-pay | Admitting: Clinical

## 2017-10-17 VITALS — BP 110/53 | HR 80 | Wt 138.5 lb

## 2017-10-17 DIAGNOSIS — Z3481 Encounter for supervision of other normal pregnancy, first trimester: Secondary | ICD-10-CM

## 2017-10-17 DIAGNOSIS — O09521 Supervision of elderly multigravida, first trimester: Secondary | ICD-10-CM

## 2017-10-17 DIAGNOSIS — Z3480 Encounter for supervision of other normal pregnancy, unspecified trimester: Secondary | ICD-10-CM

## 2017-10-17 DIAGNOSIS — O09529 Supervision of elderly multigravida, unspecified trimester: Secondary | ICD-10-CM

## 2017-10-17 DIAGNOSIS — Z113 Encounter for screening for infections with a predominantly sexual mode of transmission: Secondary | ICD-10-CM

## 2017-10-17 LAB — POCT URINALYSIS DIP (DEVICE)
Bilirubin Urine: NEGATIVE
Glucose, UA: NEGATIVE mg/dL
Hgb urine dipstick: NEGATIVE
KETONES UR: NEGATIVE mg/dL
Leukocytes, UA: NEGATIVE
Nitrite: NEGATIVE
PROTEIN: NEGATIVE mg/dL
SPECIFIC GRAVITY, URINE: 1.02 (ref 1.005–1.030)
Urobilinogen, UA: 0.2 mg/dL (ref 0.0–1.0)
pH: 5.5 (ref 5.0–8.0)

## 2017-10-17 NOTE — Progress Notes (Signed)
Here for new OB, given new OB packet, sent myChart text.  Signed up for [employer] app.  [facility]t schedule not being out for the month of October.  Will schedule at next prenatal visit.

## 2017-10-17 NOTE — Patient Instructions (Addendum)
Please call [phone] to sign up for free pap smear clinic. I will be there 10/16   AREA PEDIATRIC/FAMILY PRACTICE PHYSICIANS  [facility] [address], [address] [place], [place]  [address] Phone - [phone]   Fax - [phone]  [facility] [address] [place], [place] [address] Phone - [phone]   Fax - [phone]  [facility]ACK AMOS [address] [place], [place]  [address] Phone - [phone]   Fax - [phone]  [facility] [address], [address] [place], [place]  [address] Phone - [phone]   Fax - [phone]  [facility] [address] [place], [place]  [address] Phone - [phone]   Fax - [phone]  [facility] [address], [address] [place], [place]  [address] Phone - [phone]   Fax - [phone]  [facility] [address], [address] [place], [place]  [address] Phone - [phone]   Fax - [phone]  [facility] [address], [address] [place], [place]  [address] Phone - [phone]   Fax - [phone]  [facility] [address] [place], [place]  [address] Phone - [phone]   Fax - [phone] [facility] [address] [place], [place]  [address] Phone - [phone]   Fax - [phone]  [facility] [address] [place], [place]  [address] Phone - [phone]   Fax - [phone]  [facility] [address], [address] [place], [place]  [address] Phone - [phone]   Fax - [phone]  [facility] [address] [place], [place]  [address] Phone - [phone]   Fax - [phone]  [facility] [address], [address] [place], [place]  [address] Phone - [phone]  [facility] [address] [place], [place]   [address] Phone - [phone]   Fax - [phone]  [facility] [address], [address] [place], [place]  [address] Phone - [phone]   Fax - [phone]  [facility] [address] [place], [place]  [address] Phone - [phone]   Fax - [phone]  [facility] [address] [place], [place]  [address] Phone - [phone]   Fax - [phone]   [facility] [address], [address] [place], [place]  [address] Phone - [phone]   Fax - [phone]  [facility] [address], [address] [place], [place]  [address] Phone - [phone]   Fax - [phone]  DAVID RUBIN [address], [address] [place], [place]  [address] Phone - [phone]   Fax - [phone]  [facility] [address], [address] [place], [place]  [address] Phone - [phone]   Fax - [phone]  [facility] [address] [place], [place]  [address] Phone - [phone]   Fax - [phone] [facility] [place] [address] [place], [place]  [address] Phone - [phone]   Fax - [phone]  [facility] [address] [place], [place]  [address] Phone - [phone]   Fax - [phone]  [facility] [place] [address] [place], [place]  [address] Phone - [phone]   Fax - [phone]  [facility] [place] Wyvonne Lenzharlene Flemming MD [address] [place] [address] Phone [phone]  Fax [phone]

## 2017-10-17 NOTE — Progress Notes (Addendum)
Subjective:   Brittany Mejia is a 37 y.o. G3P2002 at 4981w4d by LMP being seen today for her first obstetrical visit.  Her obstetrical history is significant for advanced maternal age. Patient does intend to breast feed. Pregnancy history fully reviewed. Planned pregnancy, happy about pregnancy. She does have support at home.   Patient reports no complaints.  HISTORY: OB History  Gravida Para Term Preterm AB Living  3 2 2  0 0 2  SAB TAB Ectopic Multiple Live Births  0 0 0 0 2    # Outcome Date GA Lbr Len/2nd Weight Sex Delivery Anes PTL Lv  3 Current           2 Term 06/08/10 6432w0d  8 lb (3.629 kg) M Vag-Spont   LIV     Name: PADILLAOROSCO,CESAR  1 Term 01/04/03 6958w0d  6 lb 13 oz (3.09 kg) F Vag-Spont  N LIV     Name: [place]    Last pap smear was done 2015 and was normal  History reviewed. No pertinent past medical history. Past Surgical History:  Procedure Laterality Date  . APPENDECTOMY     Performed in [place] at age 89   Family History  Problem Relation Age of Onset  . Diabetes Mother   . Hypertension Mother    Social History   Tobacco Use  . Smoking status: Never Smoker  . Smokeless tobacco: Never Used  Substance Use Topics  . Alcohol use: Not Currently    Comment: OCC  . Drug use: No   No Known Allergies Current Outpatient Medications on File Prior to Visit  Medication Sig Dispense Refill  . Prenatal Vit-Fe Fumarate-FA (PRENATAL PO) Take 1 tablet by mouth daily.     No current facility-administered medications on file prior to visit.     Review of Systems Pertinent items noted in HPI and remainder of comprehensive ROS otherwise negative.  Exam   Vitals:   10/17/17 1523  BP: (!) 110/53  Pulse: 80  Weight: 138 lb 8 oz (62.8 kg)   Fetal Heart Rate (bpm): 162  BP (!) 110/53   Pulse 80   Wt 138 lb 8 oz (62.8 kg)   LMP 07/28/2017 (Exact Date)   BMI 26.17 kg/m  Uterine Size: size equals dates  Pelvic Exam:    Perineum: No Hemorrhoids,  Normal Perineum   Vulva: normal   Skin: normal coloration and turgor, no rashes    Neurologic: negative   Extremities: normal strength, tone, and muscle mass   HEENT neck supple with midline trachea and thyroid without masses   Mouth/Teeth mucous membranes moist, pharynx normal without lesions   Neck supple and no masses   Cardiovascular: regular rate and rhythm, no murmurs or gallops   Respiratory:  appears well, vitals normal, no respiratory distress, acyanotic, normal RR, neck free of mass or lymphadenopathy, chest clear, no wheezing, crepitations, rhonchi, normal symmetric air entry   Abdomen: soft, non-tender; bowel sounds normal; no masses,  no organomegaly   Urinary: urethral meatus normal   Assessment:   Pregnancy: [id number] Patient Active Problem List   Diagnosis Date Noted  . Supervision of other normal pregnancy, antepartum 10/17/2017  . AMA (advanced maternal age) multigravida 35+ 10/17/2017  . IUD (intrauterine device) in place 09/14/2015  . Left breast mass 06/10/2013  . Pregnancy, supervision of normal 03/28/2010  . ABNORMAL MATERNAL GLUCOSE TOLERANCE ANTEPARTUM 03/14/2010     Plan:   1. Supervision of other normal pregnancy, antepartum - Culture, OB  Urine - GC/Chlamydia probe amp ([facility])not at [facility] - Obstetric Panel, Including HIV - CHL AMB BABYSCRIPTS OPT IN - Hemoglobinopathy Evaluation - Genetic counseling; Future  - pap not done today due to self pay. Information given on the free pap clinic. Patient plans to go there this fall for pap.   2. Antepartum multigravida of advanced maternal age - Genetic counseling; Future - RX ASA 81 mg daily Start now.    Other orders - POCT urinalysis dip (device) - Prenatal Vit-Fe Fumarate-FA (PRENATAL PO); Take 1 tablet by mouth daily. - Detailed [place] needed to be schedule at the HD, attempted to schedule however the schedule is not available yet.    Initial labs drawn. Continue prenatal vitamins. Genetic  Screening discussed, Discussed: undecided. Self pay, costs given on panorama. Will decide by next visit.  Ultrasound discussed; fetal anatomic survey: to be done at HD Problem list reviewed and updated. The nature of [facility] with multiple MDs and other Advanced Practice Providers was explained to patient; also emphasized that residents, students are part of our team. Routine obstetric precautions reviewed. Return in about 4 weeks (around 11/14/2017).     Quinley Nesler, Harolyn Rutherford, NP Obstetrician & Gynecologist, [facility] for [employer], [facility]

## 2017-10-17 NOTE — BH Specialist Note (Signed)
error 

## 2017-10-20 LAB — OBSTETRIC PANEL, INCLUDING HIV
ANTIBODY SCREEN: NEGATIVE
BASOS: 0 %
Basophils Absolute: 0 10*3/uL (ref 0.0–0.2)
EOS (ABSOLUTE): 0.1 10*3/uL (ref 0.0–0.4)
EOS: 1 %
HEMATOCRIT: 35.4 % (ref 34.0–46.6)
HEMOGLOBIN: 11.5 g/dL (ref 11.1–15.9)
HIV Screen 4th Generation wRfx: NONREACTIVE
Hepatitis B Surface Ag: NEGATIVE
IMMATURE GRANULOCYTES: 0 %
Immature Grans (Abs): 0 10*3/uL (ref 0.0–0.1)
LYMPHS ABS: 2.3 10*3/uL (ref 0.7–3.1)
LYMPHS: 28 %
MCH: 29.5 pg (ref 26.6–33.0)
MCHC: 32.5 g/dL (ref 31.5–35.7)
MCV: 91 fL (ref 79–97)
Monocytes Absolute: 0.5 10*3/uL (ref 0.1–0.9)
Monocytes: 7 %
NEUTROS PCT: 64 %
Neutrophils Absolute: 5.2 10*3/uL (ref 1.4–7.0)
Platelets: 277 10*3/uL (ref 150–450)
RBC: 3.9 x10E6/uL (ref 3.77–5.28)
RDW: 13.6 % (ref 12.3–15.4)
RH TYPE: POSITIVE
RPR: NONREACTIVE
Rubella Antibodies, IGG: 25 index (ref >0.99)
WBC: 8.1 10*3/uL (ref 3.4–10.8)

## 2017-10-20 LAB — HEMOGLOBINOPATHY EVALUATION
Ferritin: 99 ng/mL (ref 15–150)
HGB A2 QUANT: 2.4 % (ref 1.8–3.2)
HGB A: 97.6 % (ref 96.4–98.8)
HGB F QUANT: 0 % (ref 0.0–2.0)
Hgb C: 0 %
Hgb S: 0 %
Hgb Solubility: NEGATIVE
Hgb Variant: 0 %

## 2017-10-20 LAB — CULTURE, OB URINE

## 2017-10-20 LAB — URINE CULTURE, OB REFLEX

## 2017-10-21 LAB — GC/CHLAMYDIA PROBE AMP (~~LOC~~) NOT AT ARMC
Chlamydia: NEGATIVE
Neisseria Gonorrhea: NEGATIVE

## 2017-10-21 MED ORDER — ASPIRIN EC 81 MG PO TBEC: 81.0000 mg | DELAYED_RELEASE_TABLET | Freq: Every day | ORAL | 1 refills | Status: DC

## 2017-10-21 NOTE — Addendum Note (Signed)
Addended by: Venia Carbon I on: 10/21/2017 08:11 AM   Modules accepted: Orders

## 2017-10-27 ENCOUNTER — Encounter (HOSPITAL_COMMUNITY): Payer: Self-pay

## 2017-10-27 ENCOUNTER — Ambulatory Visit (HOSPITAL_COMMUNITY)
Admission: RE | Admit: 2017-10-27 | Discharge: 2017-10-27 | Disposition: A | Discharge status: Home / Self Care | Payer: Self-pay | Source: Ambulatory Visit | Attending: Advanced Practice Midwife | Admitting: Advanced Practice Midwife

## 2017-10-27 NOTE — ED Notes (Signed)
Patient in session with Genetic Counselor. 

## 2017-10-27 NOTE — ED Notes (Signed)
Patient declined genetic testing at this time.

## 2017-11-03 ENCOUNTER — Telehealth: Payer: Self-pay

## 2017-11-03 MED ORDER — PREPLUS 27-1 MG PO TABS: 1.0000 | ORAL_TABLET | Freq: Every day | ORAL | 6 refills | Status: DC

## 2017-11-03 NOTE — Telephone Encounter (Signed)
Pt request via MyChart to have an Rx for PNV.  PNV e-prescribed.

## 2017-11-13 ENCOUNTER — Encounter: Payer: Self-pay | Admitting: *Deleted

## 2017-11-17 ENCOUNTER — Encounter: Payer: Self-pay | Admitting: Advanced Practice Midwife

## 2017-11-17 ENCOUNTER — Ambulatory Visit (INDEPENDENT_AMBULATORY_CARE_PROVIDER_SITE_OTHER): Payer: Self-pay | Admitting: Advanced Practice Midwife

## 2017-11-17 DIAGNOSIS — Z3480 Encounter for supervision of other normal pregnancy, unspecified trimester: Secondary | ICD-10-CM

## 2017-11-17 NOTE — Patient Instructions (Signed)

## 2017-11-17 NOTE — Progress Notes (Signed)
   PRENATAL VISIT NOTE  Subjective:  Brittany Mejia is a 37 y.o. G3P2002 at [redacted]w[redacted]d being seen today for ongoing prenatal care.  She is currently monitored for the following issues for this low-risk pregnancy and has Left breast mass; Supervision of other normal pregnancy, antepartum; and AMA (advanced maternal age) multigravida 35+ on their problem list.  Patient reports no complaints.  Contractions: Not present. Vag. Bleeding: None.  Movement: Present. Denies leaking of fluid.   The following portions of the patient's history were reviewed and updated as appropriate: allergies, current medications, past family history, past medical history, past social history, past surgical history and problem list. Problem list updated.  Objective:   Vitals:   11/17/17 1529  BP: (!) 115/48  Pulse: 71  Weight: 139 lb 12.8 oz (63.4 kg)    Fetal Status: Fetal Heart Rate (bpm): 138 Fundal Height: 16 cm Movement: Present     General:  Alert, oriented and cooperative. Patient is in no acute distress.  Skin: Skin is warm and dry. No rash noted.   Cardiovascular: Normal heart rate noted  Respiratory: Normal respiratory effort, no problems with respiration noted  Abdomen: Soft, gravid, appropriate for gestational age.  Pain/Pressure: Present     Pelvic: Cervical exam deferred        Extremities: Normal range of motion.  Edema: None  Mental Status: Normal mood and affect. Normal behavior. Normal judgment and thought content.   Assessment and Plan:  Pregnancy: G3P2002 at [redacted]w[redacted]d  1. Supervision of other normal pregnancy, antepartum - Anatomy scan at the HD scheduled   Preterm labor symptoms and general obstetric precautions including but not limited to vaginal bleeding, contractions, leaking of fluid and fetal movement were reviewed in detail with the patient. Please refer to After Visit Summary for other counseling recommendations.  Return in about 4 weeks (around 12/15/2017).  No future  appointments.  Thressa Sheller, CNM

## 2017-11-27 ENCOUNTER — Encounter: Payer: Self-pay | Admitting: *Deleted

## 2017-12-15 ENCOUNTER — Ambulatory Visit (INDEPENDENT_AMBULATORY_CARE_PROVIDER_SITE_OTHER): Payer: Self-pay | Admitting: Family Medicine

## 2017-12-15 VITALS — BP 101/61 | HR 79 | Wt 143.9 lb

## 2017-12-15 DIAGNOSIS — Z3480 Encounter for supervision of other normal pregnancy, unspecified trimester: Secondary | ICD-10-CM

## 2017-12-15 NOTE — Progress Notes (Signed)
   PRENATAL VISIT NOTE  Subjective:  Brittany Mejia is a 37 y.o. G3P2002 at [redacted]w[redacted]d being seen today for ongoing prenatal care.  She is currently monitored for the following issues for this low-risk pregnancy and has Left breast mass; Supervision of other normal pregnancy, antepartum; and AMA (advanced maternal age) multigravida 35+ on their problem list.  Patient reports no complaints.  Contractions: Not present. Vag. Bleeding: None.  Movement: Present. Denies leaking of fluid.   The following portions of the patient's history were reviewed and updated as appropriate: allergies, current medications, past family history, past medical history, past social history, past surgical history and problem list. Problem list updated.  Objective:   Vitals:   12/15/17 1550  BP: 101/61  Pulse: 79  Weight: 143 lb 14.4 oz (65.3 kg)    Fetal Status: Fetal Heart Rate (bpm): 144 Fundal Height: 20 cm Movement: Present     General:  Alert, oriented and cooperative. Patient is in no acute distress.  Skin: Skin is warm and dry. No rash noted.   Cardiovascular: Normal heart rate noted  Respiratory: Normal respiratory effort, no problems with respiration noted  Abdomen: Soft, gravid, appropriate for gestational age.  Pain/Pressure: Present     Pelvic: Cervical exam deferred        Extremities: Normal range of motion.  Edema: None  Mental Status: Normal mood and affect. Normal behavior. Normal judgment and thought content.   Assessment and Plan:  Pregnancy: G3P2002 at [redacted]w[redacted]d  1. Supervision of other normal pregnancy, antepartum - Pt doing well without concerns today - Will be traveling in December; wants to schedule next 2 visits today - Completed anatomy scan today   Preterm labor symptoms and general obstetric precautions including but not limited to vaginal bleeding, contractions, leaking of fluid and fetal movement were reviewed in detail with the patient. Please refer to After Visit Summary  for other counseling recommendations.  Return in about 4 weeks (around 01/12/2018) for ROB; please schedule next 2 visits 4 weeks apart.  Future Appointments  Date Time Provider Department Center  01/12/2018 10:55 AM Armando Reichert, CNM [facility]  01/29/2018  3:55 PM Rasch, Harolyn Rutherford, NP [facility]    Gwenevere Abbot, MD

## 2018-01-02 ENCOUNTER — Other Ambulatory Visit: Payer: Self-pay | Admitting: *Deleted

## 2018-01-02 DIAGNOSIS — O09529 Supervision of elderly multigravida, unspecified trimester: Secondary | ICD-10-CM

## 2018-01-02 MED ORDER — ASPIRIN EC 81 MG PO TBEC: 81.0000 mg | DELAYED_RELEASE_TABLET | Freq: Every day | ORAL | 1 refills | Status: DC

## 2018-01-02 NOTE — Progress Notes (Signed)
Pt. Requested refill ASA 81mg , approved by Dr.Anyanwu.

## 2018-01-12 ENCOUNTER — Ambulatory Visit (INDEPENDENT_AMBULATORY_CARE_PROVIDER_SITE_OTHER): Payer: Self-pay | Admitting: Advanced Practice Midwife

## 2018-01-12 ENCOUNTER — Encounter: Payer: Self-pay | Admitting: Advanced Practice Midwife

## 2018-01-12 VITALS — BP 106/49 | HR 82 | Wt 144.0 lb

## 2018-01-12 DIAGNOSIS — Z3A24 24 weeks gestation of pregnancy: Secondary | ICD-10-CM

## 2018-01-12 DIAGNOSIS — Z3482 Encounter for supervision of other normal pregnancy, second trimester: Secondary | ICD-10-CM

## 2018-01-12 DIAGNOSIS — Z3480 Encounter for supervision of other normal pregnancy, unspecified trimester: Secondary | ICD-10-CM

## 2018-01-12 NOTE — Patient Instructions (Signed)

## 2018-01-12 NOTE — Progress Notes (Signed)
   PRENATAL VISIT NOTE  Subjective:  Brittany Mejia is a 37 y.o. G3P2002 at 3060w0d being seen today for ongoing prenatal care.  She is currently monitored for the following issues for this low-risk pregnancy and has Left breast mass; Supervision of other normal pregnancy, antepartum; and AMA (advanced maternal age) multigravida 35+ on their problem list.  Patient reports no complaints.  Contractions: Not present. Vag. Bleeding: None.  Movement: Present. Denies leaking of fluid.   The following portions of the patient's history were reviewed and updated as appropriate: allergies, current medications, past family history, past medical history, past social history, past surgical history and problem list. Problem list updated.  Objective:   Vitals:   01/12/18 1125  BP: (!) 106/49  Pulse: 82  Weight: 144 lb (65.3 kg)    Fetal Status: Fetal Heart Rate (bpm): 146 Fundal Height: 24 cm Movement: Present     General:  Alert, oriented and cooperative. Patient is in no acute distress.  Skin: Skin is warm and dry. No rash noted.   Cardiovascular: Normal heart rate noted  Respiratory: Normal respiratory effort, no problems with respiration noted  Abdomen: Soft, gravid, appropriate for gestational age.  Pain/Pressure: Present     Pelvic: Cervical exam deferred        Extremities: Normal range of motion.  Edema: None  Mental Status: Normal mood and affect. Normal behavior. Normal judgment and thought content.   Assessment and Plan:  Pregnancy: G3P2002 at 3760w0d  1. Supervision of other normal pregnancy, antepartum - Routine care - CBC; Future - HIV Antibody (routine testing w rflx); Future - RPR; Future - Glucose Tolerance, 2 Hours w/1 Hour; Future  Preterm labor symptoms and general obstetric precautions including but not limited to vaginal bleeding, contractions, leaking of fluid and fetal movement were reviewed in detail with the patient. Please refer to After Visit Summary for other  counseling recommendations.  Return in about 4 weeks (around 02/09/2018) for needs 2hour GTT and 28 week labs at next visit .  Future Appointments  Date Time Provider Department Center  01/29/2018  3:55 PM Rasch, Harolyn RutherfordJennifer I, NP [facility]    Thressa ShellerHeather Oma Alpert, CNM

## 2018-01-29 ENCOUNTER — Ambulatory Visit (INDEPENDENT_AMBULATORY_CARE_PROVIDER_SITE_OTHER): Payer: Self-pay | Admitting: Obstetrics and Gynecology

## 2018-01-29 ENCOUNTER — Encounter: Payer: Self-pay | Admitting: Obstetrics and Gynecology

## 2018-01-29 DIAGNOSIS — Z3480 Encounter for supervision of other normal pregnancy, unspecified trimester: Secondary | ICD-10-CM

## 2018-01-29 DIAGNOSIS — Z3482 Encounter for supervision of other normal pregnancy, second trimester: Secondary | ICD-10-CM

## 2018-01-29 DIAGNOSIS — O09529 Supervision of elderly multigravida, unspecified trimester: Secondary | ICD-10-CM

## 2018-01-29 DIAGNOSIS — O09522 Supervision of elderly multigravida, second trimester: Secondary | ICD-10-CM

## 2018-01-29 MED ORDER — PREPLUS 27-1 MG PO TABS: 1.0000 | ORAL_TABLET | Freq: Every day | ORAL | 6 refills | Status: DC

## 2018-01-29 MED ORDER — ASPIRIN EC 81 MG PO TBEC: 81.0000 mg | DELAYED_RELEASE_TABLET | Freq: Every day | ORAL | 1 refills | Status: DC

## 2018-01-29 NOTE — Progress Notes (Signed)
   PRENATAL VISIT NOTE  Subjective:  Brittany Mejia is a 37 y.o. G3P2002 at 2668w3d being seen today for ongoing prenatal care.  She is currently monitored for the following issues for this low-risk pregnancy and has Left breast mass; Supervision of other normal pregnancy, antepartum; and AMA (advanced maternal age) multigravida 35+ on their problem list.  Patient reports no complaints.  Contractions: Not present. Vag. Bleeding: None.  Movement: Present. Denies leaking of fluid.   The following portions of the patient's history were reviewed and updated as appropriate: allergies, current medications, past family history, past medical history, past social history, past surgical history and problem list. Problem list updated.  Objective:   Vitals:   01/29/18 1602  BP: 111/71  Pulse: 87  Weight: 147 lb (66.7 kg)    Fetal Status: Fetal Heart Rate (bpm): 158 Fundal Height: 26 cm Movement: Present     General:  Alert, oriented and cooperative. Patient is in no acute distress.  Skin: Skin is warm and dry. No rash noted.   Cardiovascular: Normal heart rate noted  Respiratory: Normal respiratory effort, no problems with respiration noted  Abdomen: Soft, gravid, appropriate for gestational age.  Pain/Pressure: Present     Pelvic: Cervical exam deferred        Extremities: Normal range of motion.  Edema: None  Mental Status: Normal mood and affect. Normal behavior. Normal judgment and thought content.   Assessment and Plan:  Pregnancy: G3P2002 at 3668w3d  1. Supervision of other normal pregnancy, antepartum -- Prenatal course reviewed & UTD -- Pt doing well without any concerns -- Driving to [place] soon for 3 week vacation. Encouraged to stay hydrated and get up and walk every so often to prevent DVT. -- Next appt scheduled for 02/23/18 for fasting labs  Preterm labor symptoms and general obstetric precautions including but not limited to vaginal bleeding, contractions, leaking of fluid  and fetal movement were reviewed in detail with the patient. Please refer to After Visit Summary for other counseling recommendations.  Return in about 3 weeks (around 02/19/2018) for for 2 hour GTT, come fasting. .  Future Appointments  Date Time Provider Department Center  02/23/2018  8:50 AM [facility]  02/23/2018 10:35 AM Armando ReichertHogan, Heather D, CNM [facility]    Venia CarbonJennifer Rasch, NP

## 2018-02-09 ENCOUNTER — Other Ambulatory Visit: Payer: Self-pay

## 2018-02-18 NOTE — L&D Delivery Note (Addendum)
OB/GYN Faculty Practice Delivery Note  Brittany Mejia is a 38 y.o. [id number] s/p SVD at [redacted]w[redacted]d. She was admitted for IOL for postdates.   ROM: 5h 38m with clear fluid GBS Status: negative Maximum Maternal Temperature: 98.1  Labor Progress: . Augmentation with FB, cytotec and Pitocin  Delivery Date/Time: 2350 05/11/2018 Delivery: Called to room and patient was complete and pushing. Head delivered ROA. No nuchal cord present. Shoulder and body delivered in usual fashion. Infant with spontaneous cry, placed on mother's abdomen, dried and stimulated. Cord clamped x 2 after 1-minute delay, and cut by FOB. Cord blood drawn. Placenta delivered spontaneously with gentle cord traction. Fundus firm with massage and Pitocin. Labia, perineum, vagina, and cervix inspected inspected with 1st degree perineal laceration which was hemostatic, elected not to repair.   Placenta: delivered spontaneously, intact Complications: none Lacerations: 1st degree perineal, no repair  EBL: 50cc  Infant: vigorous female  APGARs 9 & 9  weight pending  Burman Nieves, MD Family Medicine Resident   I was gloved and present for entire second stage and delivery SVD without incident No difficulty with shoulders Lacerations as listed above. Hemostatic, repair not indicated  PPE including face masks worn throughout second stage, per policy   Clayton Bibles, CNM 05/12/18  12:16 AM

## 2018-02-23 ENCOUNTER — Other Ambulatory Visit: Payer: Self-pay

## 2018-02-23 ENCOUNTER — Ambulatory Visit (INDEPENDENT_AMBULATORY_CARE_PROVIDER_SITE_OTHER): Payer: Self-pay | Admitting: Advanced Practice Midwife

## 2018-02-23 ENCOUNTER — Encounter: Payer: Self-pay | Admitting: Advanced Practice Midwife

## 2018-02-23 VITALS — BP 97/55 | HR 74 | Wt 149.5 lb

## 2018-02-23 DIAGNOSIS — Z3480 Encounter for supervision of other normal pregnancy, unspecified trimester: Secondary | ICD-10-CM

## 2018-02-23 DIAGNOSIS — Z3483 Encounter for supervision of other normal pregnancy, third trimester: Secondary | ICD-10-CM

## 2018-02-23 NOTE — Progress Notes (Signed)
States took a trip to [place] and had a lot of swelling then, but none now. Declines flu/tdap.

## 2018-02-23 NOTE — Progress Notes (Signed)
   PRENATAL VISIT NOTE  Subjective:  Brittany Mejia is a 38 y.o. G3P2002 at [redacted]w[redacted]d being seen today for ongoing prenatal care.  She is currently monitored for the following issues for this low-risk pregnancy and has Left breast mass; Supervision of other normal pregnancy, antepartum; and AMA (advanced maternal age) multigravida 35+ on their problem list.  Patient reports no complaints.  Contractions: Not present. Vag. Bleeding: None.  Movement: Present. Denies leaking of fluid.   The following portions of the patient's history were reviewed and updated as appropriate: allergies, current medications, past family history, past medical history, past social history, past surgical history and problem list. Problem list updated.  Objective:   Vitals:   02/23/18 0951  BP: (!) 97/55  Pulse: 74  Weight: 149 lb 8 oz (67.8 kg)    Fetal Status: Fetal Heart Rate (bpm): 140 Fundal Height: 30 cm Movement: Present     General:  Alert, oriented and cooperative. Patient is in no acute distress.  Skin: Skin is warm and dry. No rash noted.   Cardiovascular: Normal heart rate noted  Respiratory: Normal respiratory effort, no problems with respiration noted  Abdomen: Soft, gravid, appropriate for gestational age.  Pain/Pressure: Present     Pelvic: Cervical exam deferred        Extremities: Normal range of motion.  Edema: None  Mental Status: Normal mood and affect. Normal behavior. Normal judgment and thought content.   Assessment and Plan:  Pregnancy: G3P2002 at [redacted]w[redacted]d  1. Supervision of other normal pregnancy, antepartum - Routine care - GTT today, results pending  - Declined flu and Tdap today   Preterm labor symptoms and general obstetric precautions including but not limited to vaginal bleeding, contractions, leaking of fluid and fetal movement were reviewed in detail with the patient. Please refer to After Visit Summary for other counseling recommendations.  Return in about 2 weeks  (around 03/09/2018).  Future Appointments  Date Time Provider Department Center  02/23/2018 10:35 AM Armando Reichert, CNM [facility]    Thressa Sheller DNP, CNM  02/23/18  10:07 AM

## 2018-02-24 ENCOUNTER — Other Ambulatory Visit: Payer: Self-pay

## 2018-02-24 LAB — GLUCOSE TOLERANCE, 2 HOURS W/ 1HR
GLUCOSE, 1 HOUR: 144 mg/dL (ref 65–179)
GLUCOSE, FASTING: 84 mg/dL (ref 65–91)
Glucose, 2 hour: 135 mg/dL (ref 65–152)

## 2018-02-24 LAB — CBC
HEMATOCRIT: 31.5 % — AB (ref 34.0–46.6)
Hemoglobin: 11.1 g/dL (ref 11.1–15.9)
MCH: 31.1 pg (ref 26.6–33.0)
MCHC: 35.2 g/dL (ref 31.5–35.7)
MCV: 88 fL (ref 79–97)
Platelets: 248 10*3/uL (ref 150–450)
RBC: 3.57 x10E6/uL — ABNORMAL LOW (ref 3.77–5.28)
RDW: 13.1 % (ref 11.7–15.4)
WBC: 7.4 10*3/uL (ref 3.4–10.8)

## 2018-02-24 LAB — HIV ANTIBODY (ROUTINE TESTING W REFLEX): HIV Screen 4th Generation wRfx: NONREACTIVE

## 2018-02-24 LAB — RPR: RPR Ser Ql: NONREACTIVE

## 2018-03-05 ENCOUNTER — Encounter: Payer: Self-pay | Admitting: *Deleted

## 2018-03-09 ENCOUNTER — Ambulatory Visit (INDEPENDENT_AMBULATORY_CARE_PROVIDER_SITE_OTHER): Payer: Self-pay | Admitting: Student

## 2018-03-09 VITALS — BP 109/58 | HR 80 | Wt 151.0 lb

## 2018-03-09 DIAGNOSIS — Z3480 Encounter for supervision of other normal pregnancy, unspecified trimester: Secondary | ICD-10-CM

## 2018-03-09 NOTE — Progress Notes (Signed)
   PRENATAL VISIT NOTE  Subjective:  Brittany Mejia is a 38 y.o. G3P2002 at [redacted]w[redacted]d being seen today for ongoing prenatal care.  She is currently monitored for the following issues for this low-risk pregnancy and has Left breast mass; Supervision of other normal pregnancy, antepartum; and AMA (advanced maternal age) multigravida 35+ on their problem list.  Patient reports no complaints.  Contractions: Not present. Vag. Bleeding: None.  Movement: Present. Denies leaking of fluid.   The following portions of the patient's history were reviewed and updated as appropriate: allergies, current medications, past family history, past medical history, past social history, past surgical history and problem list. Problem list updated.  Objective:   Vitals:   03/09/18 1325  BP: (!) 109/58  Pulse: 80  Weight: 151 lb (68.5 kg)    Fetal Status: Fetal Heart Rate (bpm): 158 Fundal Height: 32 cm Movement: Present     General:  Alert, oriented and cooperative. Patient is in no acute distress.  Skin: Skin is warm and dry. No rash noted.   Cardiovascular: Normal heart rate noted  Respiratory: Normal respiratory effort, no problems with respiration noted  Abdomen: Soft, gravid, appropriate for gestational age.  Pain/Pressure: Present     Pelvic: Cervical exam deferred        Extremities: Normal range of motion.  Edema: None  Mental Status: Normal mood and affect. Normal behavior. Normal judgment and thought content.   Assessment and Plan:  Pregnancy: G3P2002 at [redacted]w[redacted]d  1. Supervision of other normal pregnancy, antepartum -doing well -reviewed results from previous visit  Preterm labor symptoms and general obstetric precautions including but not limited to vaginal bleeding, contractions, leaking of fluid and fetal movement were reviewed in detail with the patient. Please refer to After Visit Summary for other counseling recommendations.  Return in about 2 weeks (around 03/23/2018) for Routine  OB.  No future appointments.  Judeth Horn, NP

## 2018-03-09 NOTE — Patient Instructions (Signed)

## 2018-03-18 ENCOUNTER — Other Ambulatory Visit: Payer: Self-pay | Admitting: Obstetrics & Gynecology

## 2018-03-18 DIAGNOSIS — O09529 Supervision of elderly multigravida, unspecified trimester: Secondary | ICD-10-CM

## 2018-03-23 ENCOUNTER — Encounter: Payer: Self-pay | Admitting: Advanced Practice Midwife

## 2018-03-23 ENCOUNTER — Ambulatory Visit (INDEPENDENT_AMBULATORY_CARE_PROVIDER_SITE_OTHER): Payer: Self-pay | Admitting: Advanced Practice Midwife

## 2018-03-23 VITALS — BP 99/62 | HR 92 | Wt 154.0 lb

## 2018-03-23 DIAGNOSIS — Z3480 Encounter for supervision of other normal pregnancy, unspecified trimester: Secondary | ICD-10-CM

## 2018-03-23 DIAGNOSIS — Z3483 Encounter for supervision of other normal pregnancy, third trimester: Secondary | ICD-10-CM

## 2018-03-23 NOTE — Progress Notes (Signed)
   PRENATAL VISIT NOTE  Subjective:  Brittany Mejia is a 38 y.o. G3P2002 at 7044w0d being seen today for ongoing prenatal care.  She is currently monitored for the following issues for this low-risk pregnancy and has Left breast mass; Supervision of other normal pregnancy, antepartum; and AMA (advanced maternal age) multigravida 35+ on their problem list.  Patient reports no complaints.  Contractions: Not present. Vag. Bleeding: None.  Movement: Present. Denies leaking of fluid.   The following portions of the patient's history were reviewed and updated as appropriate: allergies, current medications, past family history, past medical history, past social history, past surgical history and problem list. Problem list updated.  Objective:   Vitals:   03/23/18 1437  BP: 99/62  Pulse: 92  Weight: 154 lb (69.9 kg)    Fetal Status: Fetal Heart Rate (bpm): 154 Fundal Height: 34 cm Movement: Present     General:  Alert, oriented and cooperative. Patient is in no acute distress.  Skin: Skin is warm and dry. No rash noted.   Cardiovascular: Normal heart rate noted  Respiratory: Normal respiratory effort, no problems with respiration noted  Abdomen: Soft, gravid, appropriate for gestational age.  Pain/Pressure: Present     Pelvic: Cervical exam deferred        Extremities: Normal range of motion.  Edema: None  Mental Status: Normal mood and affect. Normal behavior. Normal judgment and thought content.   Assessment and Plan:  Pregnancy: G3P2002 at 4844w0d  1. Supervision of other normal pregnancy, antepartum - Routine care  Term labor symptoms and general obstetric precautions including but not limited to vaginal bleeding, contractions, leaking of fluid and fetal movement were reviewed in detail with the patient. Please refer to After Visit Summary for other counseling recommendations.  Return in about 2 weeks (around 04/06/2018).  Future Appointments  Date Time Provider Department  Center  04/06/2018  2:15 PM Allie Bossierove, Myra C, [facility]  04/14/2018 10:55 AM Allie Bossierove, Myra C, MD [facility]   Heather Hogan DNP, CNM  03/23/18  2:49 PM

## 2018-03-23 NOTE — Patient Instructions (Addendum)
VASECTOMYSocial research officer, government [facility]  [address] [place] [facility] [place], [place] [address] Location Hours: 8:00 a. m. - 5:00 p. m.  Phone: [phone]   AREA PEDIATRIC/FAMILY PRACTICE PHYSICIANS  [facility] [address] [place], [place]  [address] Phone - [phone]   Fax - [phone]  [facility] [place] [address] [place], [place] [address] Phone - [phone]   Fax - [phone]  [facility] [address] [place], [place]  [address] Phone - [phone]   Fax - [phone]  [facility] [address] [place], [place]  [address] Phone - [phone]   Fax - [phone]  [facility] [address] [place], [place]  [address] Phone - [phone]   Fax - [phone]  [facility] [address] [place], [place]  [address] Phone - [phone]   Fax - [phone]  [facility] [place] [address] [place], [place]  [address] Phone - [phone]   Fax - [phone]  [facility] AT [facility] [address] [place], [address] [place], [place]  [address] Phone - [phone]   Fax - [phone]  [facility] AT [facility] [address] [place], [place]  [address] Phone - [phone]   Fax - [phone] [facility] [place] [address] [place], [place]  [address] Phone - [phone]   Fax - [phone]  [facility] [address] [place], [place]  [address] Phone - [phone]   Fax - [phone]  [facility] [address] [place], [place]  [address] Phone - [phone]   Fax - [phone]  [facility] [place] [address] [place], [place]  [address] Phone - [phone]   Fax - [phone]  [facility] [address]  Suite [place], [place]  [address] Phone - [phone]  [facility] [address] [place], [place]  [address] Phone - [phone]   Fax - [phone]  [facility] [address] [place], [place]  [address] Phone - [phone]   Fax - [phone]  [facility] [address] [place], [place]  [address] Phone - [phone]   Fax - [phone]  [facility] [address] [place], [place]  [address] Phone - [phone]   Fax - [phone]   [facility] [address] [place], [place]  [address] Phone - [phone]   Fax - [phone]  [facility] [address] [place], [place]  [address] Phone - [phone]   Fax - [phone]  DAVID RUBIN [address] [place], [place]  [address] Phone - [phone]   Fax - [phone]  [facility] [address] [place], [place]  [address] Phone - [phone]   Fax - [phone]  [place] [facility] Alita Chyle [address] [place], [place]  [address] Phone - [phone]   Fax - [phone] Gerarda Fraction [address] [place], [place]  [address] Phone - [phone]   Fax - [phone]  [facility] [address] [place], [place]  [address] Phone - [phone]   Fax - [phone]  [facility] - [place] [address] [place], [place]  [address] Phone - [phone]   Fax - [phone]  [facility] - Ragan Wyvonne Lenzharlene Flemming MD [address] [place] [address] Phone [phone]  Fax [phone]

## 2018-04-06 ENCOUNTER — Ambulatory Visit (INDEPENDENT_AMBULATORY_CARE_PROVIDER_SITE_OTHER): Payer: Self-pay | Admitting: Obstetrics & Gynecology

## 2018-04-06 DIAGNOSIS — Z3A36 36 weeks gestation of pregnancy: Secondary | ICD-10-CM

## 2018-04-06 DIAGNOSIS — Z113 Encounter for screening for infections with a predominantly sexual mode of transmission: Secondary | ICD-10-CM

## 2018-04-06 DIAGNOSIS — Z3480 Encounter for supervision of other normal pregnancy, unspecified trimester: Secondary | ICD-10-CM

## 2018-04-06 DIAGNOSIS — Z3483 Encounter for supervision of other normal pregnancy, third trimester: Secondary | ICD-10-CM

## 2018-04-06 LAB — OB RESULTS CONSOLE GBS: GBS: NEGATIVE

## 2018-04-06 MED ORDER — HYDROCORTISONE ACETATE 25 MG RE SUPP: 25.0000 mg | Freq: Two times a day (BID) | RECTAL | 1 refills | Status: DC

## 2018-04-06 NOTE — Progress Notes (Addendum)
   PRENATAL VISIT NOTE  Subjective:  Brittany Mejia is a 38 y.o. G3P2002 at [redacted]w[redacted]d being seen today for ongoing prenatal care.  She is currently monitored for the following issues for this low-risk pregnancy and has Left breast mass; Supervision of other normal pregnancy, antepartum; and AMA (advanced maternal age) multigravida 35+ on their problem list.  Patient reports no complaints.  Contractions: Irritability. Vag. Bleeding: None.  Movement: Present. Denies leaking of fluid.   The following portions of the patient's history were reviewed and updated as appropriate: allergies, current medications, past family history, past medical history, past social history, past surgical history and problem list. Problem list updated.  Objective:   Vitals:   04/06/18 1434  BP: 103/61  Pulse: 84  Weight: 153 lb 11.2 oz (69.7 kg)    Fetal Status: Fetal Heart Rate (bpm): 134   Movement: Present     General:  Alert, oriented and cooperative. Patient is in no acute distress.  Skin: Skin is warm and dry. No rash noted.   Cardiovascular: Normal heart rate noted  Respiratory: Normal respiratory effort, no problems with respiration noted  Abdomen: Soft, gravid, appropriate for gestational age.  Pain/Pressure: Present     Pelvic: Cervical exam performed        Extremities: Normal range of motion.  Edema: None  Mental Status: Normal mood and affect. Normal behavior. Normal judgment and thought content.   Assessment and Plan:  Pregnancy: G3P2002 at [redacted]w[redacted]d  1. Supervision of other normal pregnancy, antepartum  - Cervicovaginal ancillary only( [place]) - Culture, beta strep (group b only)  Anusol prescribed per patient request.  Preterm labor symptoms and general obstetric precautions including but not limited to vaginal bleeding, contractions, leaking of fluid and fetal movement were reviewed in detail with the patient. Please refer to After Visit Summary for other counseling  recommendations.  Return in about 1 week (around 04/13/2018).  Future Appointments  Date Time Provider Department Center  04/14/2018 10:55 AM Allie Bossier, MD [facility]    Allie Bossier, MD

## 2018-04-06 NOTE — Addendum Note (Signed)
Addended by: Allie Bossier on: 04/06/2018 02:59 PM   Modules accepted: Orders

## 2018-04-07 ENCOUNTER — Encounter (HOSPITAL_COMMUNITY): Payer: Self-pay | Admitting: *Deleted

## 2018-04-07 ENCOUNTER — Inpatient Hospital Stay (HOSPITAL_COMMUNITY)
Admission: AD | Admit: 2018-04-07 | Discharge: 2018-04-07 | Disposition: A | Discharge status: Home / Self Care | Payer: Self-pay | Attending: Family Medicine | Admitting: Family Medicine

## 2018-04-07 DIAGNOSIS — Z3A36 36 weeks gestation of pregnancy: Secondary | ICD-10-CM | POA: Insufficient documentation

## 2018-04-07 DIAGNOSIS — Z3480 Encounter for supervision of other normal pregnancy, unspecified trimester: Secondary | ICD-10-CM

## 2018-04-07 DIAGNOSIS — O4693 Antepartum hemorrhage, unspecified, third trimester: Secondary | ICD-10-CM | POA: Insufficient documentation

## 2018-04-07 HISTORY — DX: Headache: R51

## 2018-04-07 HISTORY — DX: Headache, unspecified: R51.9

## 2018-04-07 LAB — URINALYSIS, ROUTINE W REFLEX MICROSCOPIC
Bilirubin Urine: NEGATIVE
Glucose, UA: NEGATIVE mg/dL
Hgb urine dipstick: NEGATIVE
Ketones, ur: NEGATIVE mg/dL
LEUKOCYTE UA: NEGATIVE
NITRITE: NEGATIVE
PROTEIN: NEGATIVE mg/dL
Specific Gravity, Urine: 1.015 (ref 1.005–1.030)
pH: 6.5 (ref 5.0–8.0)

## 2018-04-07 LAB — CERVICOVAGINAL ANCILLARY ONLY
Chlamydia: NEGATIVE
Neisseria Gonorrhea: NEGATIVE

## 2018-04-07 LAB — WET PREP, GENITAL
CLUE CELLS WET PREP: NONE SEEN
Sperm: NONE SEEN
Trich, Wet Prep: NONE SEEN
Yeast Wet Prep HPF POC: NONE SEEN

## 2018-04-07 NOTE — MAU Provider Note (Signed)
History     CSN: [id number]  Arrival date and time: 04/07/18 1840   First Provider Initiated Contact with Patient 04/07/18 1953      Chief Complaint  Patient presents with  . Vaginal Bleeding   Brittany Mejia is a 38 y.o. G3P2002 at [redacted]w[redacted]d who presents for Vaginal Bleeding.  She states that she went for a walk and upon using the bathroom she noted brownish red discharge in her underwear. She states she also had the discharge with wiping, but none upon arrival to the MAU.  She endorses fetal movement and denies cramping and contractions.  Patient denies issues with vaginal discharge prior to this incident and states she has "the normal yellowish white discharge."  Patient denies issues with urination, constipation, or diarrhea.   She also denies recent sexual activity. She endorses that she had a cervical exam yesterday in the office.     OB History    Gravida  3   Para  2   Term  2   Preterm  0   AB  0   Living  2     SAB      TAB      Ectopic      Multiple      Live Births  2           Past Medical History:  Diagnosis Date  . Headache     Past Surgical History:  Procedure Laterality Date  . APPENDECTOMY     Performed in [place] at age 54    Family History  Problem Relation Age of Onset  . Diabetes Mother   . Hypertension Mother     Social History   Tobacco Use  . Smoking status: Never Smoker  . Smokeless tobacco: Never Used  Substance Use Topics  . Alcohol use: Not Currently    Comment: OCC  . Drug use: No    Allergies: No Known Allergies  Medications Prior to Admission  Medication Sig Dispense Refill Last Dose  . ASPIRIN LOW DOSE 81 MG EC tablet TAKE 1 TABLET BY MOUTH DAILY 30 tablet 1 04/06/2018 at Unknown time  . Prenatal Vit-Fe Fumarate-FA (PREPLUS) 27-1 MG TABS Take 1 tablet by mouth daily. 30 tablet 6 04/06/2018 at Unknown time  . hydrocortisone (ANUSOL-HC) 25 MG suppository Place 1 suppository (25 mg total) rectally 2 (two)  times daily. 12 suppository 1     Review of Systems  Constitutional: Negative for chills and fever.  Gastrointestinal: Negative for constipation, diarrhea, nausea and vomiting.  Genitourinary: Positive for vaginal bleeding. Negative for difficulty urinating and dysuria.  Neurological: Negative for dizziness, light-headedness and headaches.   Physical Exam   Blood pressure 106/63, pulse 72, temperature 98.1 F (36.7 C), temperature source Oral, resp. rate 16, height 5\' 2"  (1.575 m), weight 71.2 kg, last menstrual period 07/28/2017, SpO2 100 %.  Physical Exam  Constitutional: She is oriented to person, place, and time. She appears well-developed and well-nourished. No distress.  HENT:  Head: Normocephalic and atraumatic.  Eyes: Conjunctivae are normal.  Neck: Normal range of motion.  Cardiovascular: Normal rate.  Respiratory: Effort normal.  GI: Soft.  Genitourinary: Cervix exhibits no motion tenderness.    Vaginal discharge present.     No vaginal bleeding.  No bleeding in the vagina.    Genitourinary Comments: Sterile Speculum Exam: -Vaginal Vault: Pink mucosa with notable sulcus prolapse.  Moderate amt white watery discharge in vault  -wet prep and fern collected -Cervix:Unable to visualize due  to sulcus prolapse.  -Bimanual Exam: Closed/50/Ballotable   Musculoskeletal: Normal range of motion.  Neurological: She is alert and oriented to person, place, and time.  Skin: Skin is warm and dry.  Psychiatric: She has a normal mood and affect. Her behavior is normal.    Fetal Assessment 125 bpm, Mod Var, -Decels, +Accels Toco: None   MAU Course   Results for orders placed or performed during the hospital encounter of 04/07/18 (from the past 24 hour(s))  Urinalysis, Routine w reflex microscopic     Status: None   Collection Time: 04/07/18  7:07 PM  Result Value Ref Range   Color, Urine YELLOW YELLOW   APPearance CLEAR CLEAR   Specific Gravity, Urine 1.015 1.005 - 1.030   pH  6.5 5.0 - 8.0   Glucose, UA NEGATIVE NEGATIVE mg/dL   Hgb urine dipstick NEGATIVE NEGATIVE   Bilirubin Urine NEGATIVE NEGATIVE   Ketones, ur NEGATIVE NEGATIVE mg/dL   Protein, ur NEGATIVE NEGATIVE mg/dL   Nitrite NEGATIVE NEGATIVE   Leukocytes,Ua NEGATIVE NEGATIVE  Wet prep, genital     Status: Abnormal   Collection Time: 04/07/18  7:58 PM  Result Value Ref Range   Yeast Wet Prep HPF POC NONE SEEN NONE SEEN   Trich, Wet Prep NONE SEEN NONE SEEN   Clue Cells Wet Prep HPF POC NONE SEEN NONE SEEN   WBC, Wet Prep HPF POC MANY (A) NONE SEEN   Sperm NONE SEEN     MDM Pelvic Exam with wet prep Labs: UA, Fern   Assessment and Plan  38 year old G3P2002 at 36.1 weeks Cat I FT Vaginal Bleeding  -Exam findings discussed -Wet prep pending -Fern Negative -Informed that GC/CT collected yesterday was negative -Reactive NST -Will await Wet prep results  ,.Follow Up (8:43 PM) -Wet prep returns negative  -Results discussed with patient -Bleeding and labor precautions given. -Encouraged to call or come to MAU if she has any questions or concerns -Keep appt as scheduled: Tuesday Feb 25 -Discharged to home in stable condition  Cherre Robins MSN, CNM 04/07/2018, 7:53 PM

## 2018-04-07 NOTE — Discharge Instructions (Signed)
Vaginal Bleeding During Pregnancy, Third Trimester ° °A small amount of bleeding from the vagina (spotting) is relatively common during pregnancy. Various things can cause bleeding or spotting during pregnancy. Sometimes bleeding is normal and is not a problem. However, bleeding during the third trimester can also be a sign of something serious for the mother and the baby. Be sure to tell your health care provider about any vaginal bleeding right away. °Some possible causes of vaginal bleeding during the third trimester include: °· Infection or growths (polyps) on the cervix. °· A condition in which the placenta partially or completely covers the opening of the cervix inside the uterus (placenta previa). °· The placenta separating from the uterus (placenta abruption). °· The start of labor (discharging of the mucus plug). °· A condition in which the placenta grows into the muscle layer of the uterus (placenta accreta). °Follow these instructions at home: °Activity °· Follow instructions from your health care provider about limiting your activity. If your health care provider recommends activity restriction, you may need to stay in bed and only get up to use the bathroom. In some cases, your health care provider may allow you to continue light activity. °· If needed, make plans for someone to help with your regular activities. °· Ask your health care provider if it is safe for you to drive. °· Do not lift anything that is heavier than 10 lb (4.5 kg), or the limit that your health care provider tells you, until he or she says that it is safe. °· Do not have sex or orgasms until your health care provider says that this is safe. °Medicines °· Take over-the-counter and prescription medicines only as told by your health care provider. °· Do not take aspirin because it can cause bleeding. °General instructions °· Pay attention to any changes in your symptoms. °· Write down how many pads you use each day, how often you  change pads, and how soaked (saturated) they are. °· Do not use tampons or douche. °· If you pass any tissue from your vagina, save the tissue so you can show it to your health care provider. °· Keep all follow-up visits as told by your health care provider. This is important. °Contact a health care provider if: °· You have vaginal bleeding during any part of your pregnancy. °· You have cramps or labor pains. °· You have a fever. °Get help right away if: °· You have severe cramps or pain in your back or abdomen. °· You have a gush of fluid from the vagina. °· You pass large clots or a large amount of tissue from your vagina. °· Your bleeding increases. °· You feel light-headed or weak. °· You faint. °· You feel that your baby is moving less than usual, or not moving at all. °Summary °· Various things can cause bleeding or spotting in pregnancy. °· Bleeding during the third trimester can be a sign of a serious problem for the mother and the baby. °· Be sure to tell your health care provider about any vaginal bleeding right away. °This information is not intended to replace advice given to you by your health care provider. Make sure you discuss any questions you have with your health care provider. °Document Released: 04/27/2002 Document Revised: 05/09/2016 Document Reviewed: 05/09/2016 °Elsevier Interactive Patient Education © 2019 [employer] ° °

## 2018-04-07 NOTE — MAU Note (Signed)
Pt reports vaginal bleeding today, brown/red in color. Some pressure in lower back.

## 2018-04-09 LAB — CULTURE, BETA STREP (GROUP B ONLY): Strep Gp B Culture: NEGATIVE

## 2018-04-14 ENCOUNTER — Ambulatory Visit (INDEPENDENT_AMBULATORY_CARE_PROVIDER_SITE_OTHER): Payer: Self-pay | Admitting: Obstetrics & Gynecology

## 2018-04-14 ENCOUNTER — Encounter: Payer: Self-pay | Admitting: Obstetrics & Gynecology

## 2018-04-14 VITALS — BP 120/72 | HR 75 | Wt 154.7 lb

## 2018-04-14 DIAGNOSIS — Z3480 Encounter for supervision of other normal pregnancy, unspecified trimester: Secondary | ICD-10-CM

## 2018-04-14 DIAGNOSIS — O09523 Supervision of elderly multigravida, third trimester: Secondary | ICD-10-CM

## 2018-04-14 DIAGNOSIS — Z3A37 37 weeks gestation of pregnancy: Secondary | ICD-10-CM

## 2018-04-14 DIAGNOSIS — Z3483 Encounter for supervision of other normal pregnancy, third trimester: Secondary | ICD-10-CM

## 2018-04-14 NOTE — Progress Notes (Signed)
   PRENATAL VISIT NOTE  Subjective:  Brittany Mejia is a 38 y.o. G3P2002 at [redacted]w[redacted]d being seen today for ongoing prenatal care.  She is currently monitored for the following issues for this low-risk pregnancy and has Left breast mass; Supervision of other normal pregnancy, antepartum; and AMA (advanced maternal age) multigravida 35+ on their problem list.  Patient reports no complaints.  Contractions: Irritability. Vag. Bleeding: None.  Movement: Present. Denies leaking of fluid.   The following portions of the patient's history were reviewed and updated as appropriate: allergies, current medications, past family history, past medical history, past social history, past surgical history and problem list. Problem list updated.  Objective:   Vitals:   04/14/18 1106  BP: 120/72  Pulse: 75  Weight: 154 lb 11.2 oz (70.2 kg)    Fetal Status: Fetal Heart Rate (bpm): 145   Movement: Present     General:  Alert, oriented and cooperative. Patient is in no acute distress.  Skin: Skin is warm and dry. No rash noted.   Cardiovascular: Normal heart rate noted  Respiratory: Normal respiratory effort, no problems with respiration noted  Abdomen: Soft, gravid, appropriate for gestational age.  Pain/Pressure: Present     Pelvic: Cervical exam performed        Extremities: Normal range of motion.  Edema: Trace  Mental Status: Normal mood and affect. Normal behavior. Normal judgment and thought content.   Assessment and Plan:  Pregnancy: G3P2002 at [redacted]w[redacted]d  1. Multigravida of advanced maternal age in third trimester   2. Supervision of other normal pregnancy, antepartum   Term labor symptoms and general obstetric precautions including but not limited to vaginal bleeding, contractions, leaking of fluid and fetal movement were reviewed in detail with the patient. Please refer to After Visit Summary for other counseling recommendations.  No follow-ups on file.  No future appointments.  Allie Bossier, MD

## 2018-04-23 ENCOUNTER — Ambulatory Visit (INDEPENDENT_AMBULATORY_CARE_PROVIDER_SITE_OTHER): Payer: Self-pay | Admitting: Obstetrics and Gynecology

## 2018-04-23 DIAGNOSIS — Z3483 Encounter for supervision of other normal pregnancy, third trimester: Secondary | ICD-10-CM

## 2018-04-23 DIAGNOSIS — Z3480 Encounter for supervision of other normal pregnancy, unspecified trimester: Secondary | ICD-10-CM

## 2018-04-23 DIAGNOSIS — Z3A38 38 weeks gestation of pregnancy: Secondary | ICD-10-CM

## 2018-04-23 NOTE — Progress Notes (Signed)
   PRENATAL VISIT NOTE  Subjective:  Brittany Mejia is a 38 y.o. G3P2002 at [redacted]w[redacted]d being seen today for ongoing prenatal care.  She is currently monitored for the following issues for this low-risk pregnancy and has Left breast mass; Supervision of other normal pregnancy, antepartum; and AMA (advanced maternal age) multigravida 35+ on their problem list.  Patient reports vomiting and round ligament pain .  Contractions: Irregular. Vag. Bleeding: None.  Movement: Present. Denies leaking of fluid.   The following portions of the patient's history were reviewed and updated as appropriate: allergies, current medications, past family history, past medical history, past social history, past surgical history and problem list. Problem list updated.  Objective:   Vitals:   04/23/18 0919  BP: (!) 108/43  Pulse: 68  Weight: 156 lb (70.8 kg)    Fetal Status: Fetal Heart Rate (bpm): 144 Fundal Height: 38 cm Movement: Present     General:  Alert, oriented and cooperative. Patient is in no acute distress.  Skin: Skin is warm and dry. No rash noted.   Cardiovascular: Normal heart rate noted  Respiratory: Normal respiratory effort, no problems with respiration noted  Abdomen: Soft, gravid, appropriate for gestational age.  Pain/Pressure: Present     Pelvic: Cervical exam deferred        Extremities: Normal range of motion.  Edema: Trace  Mental Status: Normal mood and affect. Normal behavior. Normal judgment and thought content.   Assessment and Plan:  Pregnancy: G3P2002 at [redacted]w[redacted]d  1. Supervision of other normal pregnancy, antepartum  GBS negative  Discussed new hospital and where to go if needing care at [facility]  There are no diagnoses linked to this encounter. Term labor symptoms and general obstetric precautions including but not limited to vaginal bleeding, contractions, leaking of fluid and fetal movement were reviewed in detail with the patient. Please refer to After Visit Summary for  other counseling recommendations.  Return in about 1 week (around 04/30/2018).  Future Appointments  Date Time Provider Department Center  04/30/2018  8:35 AM Estel Scholze, Harolyn Rutherford, NP [facility]    Venia Carbon, NP

## 2018-04-23 NOTE — Progress Notes (Signed)
Left side ligament pain. 144

## 2018-04-30 ENCOUNTER — Other Ambulatory Visit: Payer: Self-pay

## 2018-04-30 ENCOUNTER — Ambulatory Visit (INDEPENDENT_AMBULATORY_CARE_PROVIDER_SITE_OTHER): Payer: Self-pay | Admitting: Obstetrics and Gynecology

## 2018-04-30 VITALS — BP 100/64 | HR 76 | Wt 156.6 lb

## 2018-04-30 DIAGNOSIS — Z3483 Encounter for supervision of other normal pregnancy, third trimester: Secondary | ICD-10-CM

## 2018-04-30 DIAGNOSIS — Z3480 Encounter for supervision of other normal pregnancy, unspecified trimester: Secondary | ICD-10-CM

## 2018-04-30 DIAGNOSIS — Z3A39 39 weeks gestation of pregnancy: Secondary | ICD-10-CM

## 2018-04-30 NOTE — Progress Notes (Signed)
   PRENATAL VISIT NOTE  Subjective:  Brittany Mejia is a 38 y.o. G3P2002 at [redacted]w[redacted]d being seen today for ongoing prenatal care.  She is currently monitored for the following issues for this low-risk pregnancy and has Left breast mass; Supervision of other normal pregnancy, antepartum; and AMA (advanced maternal age) multigravida 35+ on their problem list.  Patient reports contractions since Irregular pattern. .  Contractions: Irregular. Vag. Bleeding: None.  Movement: Present. Denies leaking of fluid.   The following portions of the patient's history were reviewed and updated as appropriate: allergies, current medications, past family history, past medical history, past social history, past surgical history and problem list.   Objective:   Vitals:   04/30/18 1041  BP: 100/64  Pulse: 76  Weight: 156 lb 9.6 oz (71 kg)    Fetal Status: Fetal Heart Rate (bpm): 146 Fundal Height: 39 cm Movement: Present  Presentation: Vertex  General:  Alert, oriented and cooperative. Patient is in no acute distress.  Skin: Skin is warm and dry. No rash noted.   Cardiovascular: Normal heart rate noted  Respiratory: Normal respiratory effort, no problems with respiration noted  Abdomen: Soft, gravid, appropriate for gestational age.  Pain/Pressure: Present     Pelvic: Cervical exam performed Dilation: 1 Effacement (%): 50 Station: Ballotable  Extremities: Normal range of motion.  Edema: Trace  Mental Status: Normal mood and affect. Normal behavior. Normal judgment and thought content.   Assessment and Plan:  Pregnancy: G3P2002 at [redacted]w[redacted]d  1. Supervision of other normal pregnancy, antepartum  Next visit for BPP and NST Membranes stripped today per patient request.   There are no diagnoses linked to this encounter. Term labor symptoms and general obstetric precautions including but not limited to vaginal bleeding, contractions, leaking of fluid and fetal movement were reviewed in detail with the  patient. Please refer to After Visit Summary for other counseling recommendations.   No follow-ups on file.  Future Appointments  Date Time Provider Department Center  05/07/2018  8:15 AM [facility]    Venia Carbon, NP

## 2018-04-30 NOTE — Progress Notes (Signed)
Side/ ligament pain better now.

## 2018-05-07 ENCOUNTER — Other Ambulatory Visit (HOSPITAL_COMMUNITY): Payer: Self-pay | Admitting: Advanced Practice Midwife

## 2018-05-07 ENCOUNTER — Ambulatory Visit (INDEPENDENT_AMBULATORY_CARE_PROVIDER_SITE_OTHER): Payer: Medicaid Other | Admitting: *Deleted

## 2018-05-07 ENCOUNTER — Telehealth: Payer: Self-pay | Admitting: Obstetrics & Gynecology

## 2018-05-07 ENCOUNTER — Other Ambulatory Visit: Payer: Self-pay

## 2018-05-07 ENCOUNTER — Ambulatory Visit: Payer: Self-pay

## 2018-05-07 ENCOUNTER — Ambulatory Visit (INDEPENDENT_AMBULATORY_CARE_PROVIDER_SITE_OTHER): Payer: Medicaid Other | Admitting: Obstetrics & Gynecology

## 2018-05-07 ENCOUNTER — Telehealth (HOSPITAL_COMMUNITY): Payer: Self-pay | Admitting: *Deleted

## 2018-05-07 VITALS — BP 107/62 | HR 71 | Wt 155.8 lb

## 2018-05-07 DIAGNOSIS — Z3480 Encounter for supervision of other normal pregnancy, unspecified trimester: Secondary | ICD-10-CM

## 2018-05-07 DIAGNOSIS — O09523 Supervision of elderly multigravida, third trimester: Secondary | ICD-10-CM

## 2018-05-07 DIAGNOSIS — Z3A40 40 weeks gestation of pregnancy: Secondary | ICD-10-CM | POA: Diagnosis not present

## 2018-05-07 DIAGNOSIS — O480 Post-term pregnancy: Secondary | ICD-10-CM

## 2018-05-07 NOTE — Progress Notes (Signed)
   PRENATAL VISIT NOTE  Subjective:  Brittany Mejia is a 38 y.o. G3P2002 at [redacted]w[redacted]d being seen today for ongoing prenatal care.  She is currently monitored for the following issues for this high-risk pregnancy and has Left breast mass; Supervision of other normal pregnancy, antepartum; and AMA (advanced maternal age) multigravida 35+ on their problem list.  Patient reports no complaints.  Contractions: Irregular. Vag. Bleeding: None.  Movement: Present. Denies leaking of fluid.   The following portions of the patient's history were reviewed and updated as appropriate: allergies, current medications, past family history, past medical history, past social history, past surgical history and problem list.   Objective:   Vitals:   05/07/18 0837  BP: 107/62  Pulse: 71  Weight: 155 lb 12.8 oz (70.7 kg)    Fetal Status: Fetal Heart Rate (bpm): NST   Movement: Present     General:  Alert, oriented and cooperative. Patient is in no acute distress.  Skin: Skin is warm and dry. No rash noted.   Cardiovascular: Normal heart rate noted  Respiratory: Normal respiratory effort, no problems with respiration noted  Abdomen: Soft, gravid, appropriate for gestational age.  Pain/Pressure: Present     Pelvic: Cervical exam performed        Extremities: Normal range of motion.  Edema: Trace  Mental Status: Normal mood and affect. Normal behavior. Normal judgment and thought content.   Assessment and Plan:  Pregnancy: G3P2002 at [redacted]w[redacted]d 1. Supervision of other normal pregnancy, antepartum   2. Multigravida of advanced maternal age in third trimester   3. Post term pregnancy, antepartum condition or complication - IOL scheduled for 41 weeks  Term labor symptoms and general obstetric precautions including but not limited to vaginal bleeding, contractions, leaking of fluid and fetal movement were reviewed in detail with the patient. Please refer to After Visit Summary for other counseling  recommendations.   Return in about 5 weeks (around 06/11/2018) for PP visit.  Future Appointments  Date Time Provider Department Center  05/07/2018  9:15 AM Allie Bossier, MD [facility]    Allie Bossier, MD

## 2018-05-07 NOTE — Progress Notes (Signed)

## 2018-05-07 NOTE — Telephone Encounter (Signed)
Called the patient to inform of virus restrictions. Left a detailed voicemail message.

## 2018-05-07 NOTE — Telephone Encounter (Signed)
Preadmission screen  

## 2018-05-08 ENCOUNTER — Other Ambulatory Visit (HOSPITAL_COMMUNITY): Payer: Self-pay | Admitting: *Deleted

## 2018-05-08 NOTE — Progress Notes (Signed)
I have reviewed this chart and agree with the RN/CMA assessment and management.    Evaleen Sant C Naima Veldhuizen, MD, FACOG Attending Physician, Faculty Practice [facility]  

## 2018-05-11 ENCOUNTER — Encounter (HOSPITAL_COMMUNITY): Payer: Self-pay

## 2018-05-11 ENCOUNTER — Encounter: Payer: Self-pay | Admitting: *Deleted

## 2018-05-11 ENCOUNTER — Inpatient Hospital Stay (HOSPITAL_COMMUNITY): Payer: Medicaid Other | Admitting: Anesthesiology

## 2018-05-11 ENCOUNTER — Inpatient Hospital Stay (HOSPITAL_COMMUNITY): Payer: Medicaid Other

## 2018-05-11 ENCOUNTER — Other Ambulatory Visit: Payer: Self-pay

## 2018-05-11 ENCOUNTER — Inpatient Hospital Stay (HOSPITAL_COMMUNITY)
Admission: AD | Admit: 2018-05-11 | Discharge: 2018-05-13 | DRG: 807 | Disposition: A | Discharge status: Home / Self Care | Payer: Medicaid Other | Attending: Obstetrics and Gynecology | Admitting: Obstetrics and Gynecology

## 2018-05-11 DIAGNOSIS — Z3A41 41 weeks gestation of pregnancy: Secondary | ICD-10-CM | POA: Diagnosis not present

## 2018-05-11 DIAGNOSIS — O480 Post-term pregnancy: Secondary | ICD-10-CM | POA: Diagnosis present

## 2018-05-11 DIAGNOSIS — O09529 Supervision of elderly multigravida, unspecified trimester: Secondary | ICD-10-CM

## 2018-05-11 LAB — CBC
HCT: 36.4 % (ref 36.0–46.0)
Hemoglobin: 11.9 g/dL — ABNORMAL LOW (ref 12.0–15.0)
MCH: 29.2 pg (ref 26.0–34.0)
MCHC: 32.7 g/dL (ref 30.0–36.0)
MCV: 89.2 fL (ref 80.0–100.0)
Platelets: 202 10*3/uL (ref 150–400)
RBC: 4.08 MIL/uL (ref 3.87–5.11)
RDW: 14 % (ref 11.5–15.5)
WBC: 7.3 10*3/uL (ref 4.0–10.5)
nRBC: 0 % (ref 0.0–0.2)

## 2018-05-11 LAB — RPR: RPR Ser Ql: NONREACTIVE

## 2018-05-11 LAB — ABO/RH: ABO/RH(D): O POS

## 2018-05-11 LAB — TYPE AND SCREEN
ABO/RH(D): O POS
Antibody Screen: NEGATIVE

## 2018-05-11 MED ORDER — EPHEDRINE 5 MG/ML INJ: 10.0000 mg | INTRAVENOUS | Status: DC | PRN

## 2018-05-11 MED ORDER — SOD CITRATE-CITRIC ACID 500-334 MG/5ML PO SOLN: 30.0000 mL | ORAL | Status: DC | PRN

## 2018-05-11 MED ORDER — OXYTOCIN BOLUS FROM INFUSION
500.0000 mL | Freq: Once | INTRAVENOUS | Status: AC
  Administered 2018-05-11: 500 mL via INTRAVENOUS

## 2018-05-11 MED ORDER — MISOPROSTOL 25 MCG QUARTER TABLET
25.0000 ug | ORAL_TABLET | ORAL | Status: DC
  Administered 2018-05-11: 25 ug via VAGINAL
  Filled 2018-05-11: qty 1

## 2018-05-11 MED ORDER — PHENYLEPHRINE 40 MCG/ML (10ML) SYRINGE FOR IV PUSH (FOR BLOOD PRESSURE SUPPORT): 80.0000 ug | PREFILLED_SYRINGE | INTRAVENOUS | Status: DC | PRN

## 2018-05-11 MED ORDER — MISOPROSTOL 50MCG HALF TABLET: 50.0000 ug | ORAL_TABLET | ORAL | Status: DC

## 2018-05-11 MED ORDER — OXYTOCIN 40 UNITS IN NORMAL SALINE INFUSION - SIMPLE MED
1.0000 m[IU]/min | INTRAVENOUS | Status: DC
  Administered 2018-05-11: 2 m[IU]/min via INTRAVENOUS

## 2018-05-11 MED ORDER — LACTATED RINGERS IV SOLN: 500.0000 mL | INTRAVENOUS | Status: DC | PRN

## 2018-05-11 MED ORDER — OXYCODONE-ACETAMINOPHEN 5-325 MG PO TABS: 1.0000 | ORAL_TABLET | ORAL | Status: DC | PRN

## 2018-05-11 MED ORDER — LIDOCAINE-EPINEPHRINE (PF) 2 %-1:200000 IJ SOLN
INTRAMUSCULAR | Status: DC | PRN
  Administered 2018-05-11 (×2): 3 mL via EPIDURAL

## 2018-05-11 MED ORDER — TERBUTALINE SULFATE 1 MG/ML IJ SOLN: 0.2500 mg | Freq: Once | INTRAMUSCULAR | Status: DC | PRN

## 2018-05-11 MED ORDER — OXYTOCIN 40 UNITS IN NORMAL SALINE INFUSION - SIMPLE MED
2.5000 [IU]/h | INTRAVENOUS | Status: DC
  Administered 2018-05-12: 2.5 [IU]/h via INTRAVENOUS
  Filled 2018-05-11: qty 1000

## 2018-05-11 MED ORDER — ONDANSETRON HCL 4 MG/2ML IJ SOLN: 4.0000 mg | Freq: Four times a day (QID) | INTRAMUSCULAR | Status: DC | PRN

## 2018-05-11 MED ORDER — FENTANYL-BUPIVACAINE-NACL 0.5-0.125-0.9 MG/250ML-% EP SOLN
12.0000 mL/h | EPIDURAL | Status: DC | PRN
  Filled 2018-05-11: qty 250

## 2018-05-11 MED ORDER — DIPHENHYDRAMINE HCL 50 MG/ML IJ SOLN: 12.5000 mg | INTRAMUSCULAR | Status: DC | PRN

## 2018-05-11 MED ORDER — LIDOCAINE HCL (PF) 1 % IJ SOLN: 30.0000 mL | INTRAMUSCULAR | Status: DC | PRN

## 2018-05-11 MED ORDER — ACETAMINOPHEN 325 MG PO TABS: 650.0000 mg | ORAL_TABLET | ORAL | Status: DC | PRN

## 2018-05-11 MED ORDER — LACTATED RINGERS IV SOLN: 500.0000 mL | Freq: Once | INTRAVENOUS | Status: DC

## 2018-05-11 MED ORDER — OXYCODONE-ACETAMINOPHEN 5-325 MG PO TABS: 2.0000 | ORAL_TABLET | ORAL | Status: DC | PRN

## 2018-05-11 MED ORDER — LACTATED RINGERS IV SOLN
INTRAVENOUS | Status: DC
  Administered 2018-05-11 (×2): via INTRAVENOUS

## 2018-05-11 MED ORDER — SODIUM CHLORIDE (PF) 0.9 % IJ SOLN
INTRAMUSCULAR | Status: DC | PRN
  Administered 2018-05-11: 12 mL/h via EPIDURAL

## 2018-05-11 NOTE — Progress Notes (Signed)
LABOR PROGRESS NOTE  Glendell Greb is a 38 y.o. G3P2002 at [redacted]w[redacted]d  admitted for IOL for postdates.  Subjective: Comfortable with epidural. Feels some tightness with contractions. Denies any concerns or complaints.  Objective: BP 112/61   Pulse 61   Temp 97.8 F (36.6 C) (Oral)   Resp 17   Ht 5\' 1"  (1.549 m)   Wt 70.7 kg   LMP 07/28/2017 (Exact Date)   SpO2 99%   BMI 29.44 kg/m  or  Vitals:   05/11/18 1631 05/11/18 1700 05/11/18 1730 05/11/18 1800  BP: (!) 103/58 100/63 101/61 112/61  Pulse: 64 64 63 61  Resp: 17  18 17   Temp:   97.8 F (36.6 C)   TempSrc:   Oral   SpO2:      Weight:      Height:       Dilation: 6 Effacement (%): 70 Cervical Position: Posterior Station: -2 Presentation: Vertex Exam by:: dr Mauri Reading FHT: baseline rate 130 bpm, moderate varibility, + acel, no decel Toco: ctx 2-3 mins  Labs: Lab Results  Component Value Date   WBC 7.3 05/11/2018   HGB 11.9 (L) 05/11/2018   HCT 36.4 05/11/2018   MCV 89.2 05/11/2018   PLT 202 05/11/2018    Patient Active Problem List   Diagnosis Date Noted  . Supervision of other normal pregnancy, antepartum 10/17/2017  . AMA (advanced maternal age) multigravida 35+ 10/17/2017  . Left breast mass 06/10/2013    Assessment / Plan: 38 y.o. G3P2002 at [redacted]w[redacted]d here for IOL for postdates  Labor: AROM at 1817 with bloody/clear fluid. Progressing well with Pit. Continue titration of Pit. Fetal Wellbeing:  Cat I Pain Control:  Epidural in place Anticipated MOD:  NSVD  Orpah Cobb, D.O. [facility], PGY1 05/11/2018, 6:40 PM

## 2018-05-11 NOTE — Anesthesia Preprocedure Evaluation (Signed)
Anesthesia Evaluation  Patient identified by MRN, date of birth, ID band Patient awake    Reviewed: Allergy & Precautions, NPO status , Patient's Chart, lab work & pertinent test results  Airway Mallampati: I  TM Distance: >3 FB Neck ROM: Full    Dental no notable dental hx.    Pulmonary neg pulmonary ROS,    Pulmonary exam normal breath sounds clear to auscultation       Cardiovascular negative cardio ROS Normal cardiovascular exam Rhythm:Regular Rate:Normal     Neuro/Psych  Headaches, negative psych ROS   GI/Hepatic negative GI ROS, Neg liver ROS,   Endo/Other  negative endocrine ROS  Renal/GU negative Renal ROS  negative genitourinary   Musculoskeletal negative musculoskeletal ROS (+)   Abdominal   Peds  Hematology negative hematology ROS (+)   Anesthesia Other Findings IOL for post dates  Reproductive/Obstetrics (+) Pregnancy                             Anesthesia Physical Anesthesia Plan  ASA: II  Anesthesia Plan: Epidural   Post-op Pain Management:    Induction:   PONV Risk Score and Plan: Treatment may vary due to age or medical condition  Airway Management Planned: Natural Airway  Additional Equipment:   Intra-op Plan:   Post-operative Plan:   Informed Consent: I have reviewed the patients History and Physical, chart, labs and discussed the procedure including the risks, benefits and alternatives for the proposed anesthesia with the patient or authorized representative who has indicated his/her understanding and acceptance.       Plan Discussed with: Anesthesiologist  Anesthesia Plan Comments: (Patient identified. Risks, benefits, options discussed with patient including but not limited to bleeding, infection, nerve damage, paralysis, failed block, incomplete pain control, headache, blood pressure changes, nausea, vomiting, reactions to medication, itching, and  post partum back pain. Confirmed with bedside nurse the patient's most recent platelet count. Confirmed with the patient that they are not taking any anticoagulation, have any bleeding history or any family history of bleeding disorders. Patient expressed understanding and wishes to proceed. All questions were answered. )        Anesthesia Quick Evaluation

## 2018-05-11 NOTE — H&P (Signed)
OBSTETRIC ADMISSION HISTORY AND PHYSICAL  Brittany Mejia is a 38 y.o. female G3P2002 with IUP at [redacted]w[redacted]d by LMP presenting for IOL for postdates. She reports +FMs, No LOF, no VB, no blurry vision, headaches or peripheral edema, and RUQ pain.  She plans on breast feeding. Her husband plans a vasectomy for birth control. She received her prenatal care at [facility]   Nursing Staff Provider  Office Location  [facility]ating  Certain LMP  Language  English  Anatomy US  Normal female  Flu Vaccine   declined Genetic Screen  NIPS:   AFP:   First Screen:  Quad:  Declined due to self pay  TDaP vaccine  declined Hgb A1C or  GTT Early  Third trimester   Ref. Range 02/23/2018 08:50  Glucose, 1 hour Latest Ref Range: 65 - 179 mg/dL 003  Glucose, Fasting Latest Ref Range: 65 - 91 mg/dL 84  Glucose, 2 hour Latest Ref Range: 65 - 152 mg/dL 704    Rhogam  O positive    LAB RESULTS   Feeding Plan Breast  Blood Type O/Positive/-- (08/30 1558)   Contraception Vasectomy Antibody Negative (08/30 1558)  Circumcision  n/a Rubella 25.00 (08/30 1558)  Pediatrician  [facility]  RPR Non Reactive (08/30 1558)   Support Person Ignacia Bayley  HBsAg Negative (08/30 1558)   Prenatal Classes NA HIV Non Reactive (08/30 1558)  BTL Consent  NA GBS  Negative   VBAC Consent  n/a  Pap Self pay, will do at the free pap clinic    Hgb Electro  Normal hgb     CF     SMA     Waterbirth  [ ]  Class [ ]  Consent [ ]  CNM visit    Prenatal History/Complications:  Past Medical History: Past Medical History:  Diagnosis Date  . Headache     Past Surgical History: Past Surgical History:  Procedure Laterality Date  . APPENDECTOMY     Performed in [place] at age 10    Obstetrical History: OB History    Gravida  3   Para  2   Term  2   Preterm  0   AB  0   Living  2     SAB      TAB      Ectopic      Multiple      Live Births  2           Social History: Social History   Socioeconomic  History  . Marital status: Married    Spouse name: Not on file  . Number of children: Not on file  . Years of education: Not on file  . Highest education level: Not on file  Occupational History    Employer: [employer]  Social Needs  . Financial resource strain: Not on file  . Food insecurity:    Worry: Never true    Inability: Never true  . Transportation needs:    Medical: No    Non-medical: No  Tobacco Use  . Smoking status: Never Smoker  . Smokeless tobacco: Never Used  Substance and Sexual Activity  . Alcohol use: Not Currently    Comment: OCC  . Drug use: No  . Sexual activity: Yes    Birth control/protection: None  Lifestyle  . Physical activity:    Days per week: Not on file    Minutes per session: Not on file  . Stress: Not on file  Relationships  .  Social connections:    Talks on phone: Not on file    Gets together: Not on file    Attends religious service: Not on file    Active member of club or organization: Not on file    Attends meetings of clubs or organizations: Not on file    Relationship status: Not on file  Other Topics Concern  . Not on file  Social History Narrative  . Not on file    Family History: Family History  Problem Relation Age of Onset  . Diabetes Mother   . Hypertension Mother     Allergies: No Known Allergies  Medications Prior to Admission  Medication Sig Dispense Refill Last Dose  . ASPIRIN LOW DOSE 81 MG EC tablet TAKE 1 TABLET BY MOUTH DAILY 30 tablet 1 Taking  . hydrocortisone (ANUSOL-HC) 25 MG suppository Place 1 suppository (25 mg total) rectally 2 (two) times daily. 12 suppository 1 Taking  . Prenatal Vit-Fe Fumarate-FA (PREPLUS) 27-1 MG TABS Take 1 tablet by mouth daily. 30 tablet 6 Taking     Review of Systems   All systems reviewed and negative except as stated in HPI  Blood pressure 105/63, pulse 61, temperature 97.7 F (36.5 C), temperature source Oral, resp. rate 17, height 5\' 1"  (1.549 m), weight  70.7 kg, last menstrual period 07/28/2017. General appearance: alert, cooperative and no distress Lungs: clear to auscultation bilaterally Heart: regular rate and rhythm Abdomen: soft, non-tender; bowel sounds normal Pelvic: n/a Extremities: Homans sign is negative, no sign of DVT DTR's +2 Presentation: cephalic Fetal monitoringBaseline: 120 bpm, Variability: Good {> 6 bpm), Accelerations: Reactive and Decelerations: Absent Uterine activityFrequency: Every 10 minutes Dilation: Closed Effacement (%): Thick Exam by:: Jasn Xia cnm  Prenatal labs: ABO, Rh: O/Positive/-- (08/30 1558) Antibody: Negative (08/30 1558) Rubella: 25.00 (08/30 1558) RPR: Non Reactive (01/06 0850)  HBsAg: Negative (08/30 1558)  HIV: Non Reactive (01/06 0850)  GBS: Negative (02/17 0000)   Prenatal Transfer Tool  Maternal Diabetes: No Genetic Screening: Normal Maternal Ultrasounds/Referrals: Normal Fetal Ultrasounds or other Referrals:  None Maternal Substance Abuse:  No Significant Maternal Medications:  None Significant Maternal Lab Results: Lab values include: Group B Strep negative  No results found for this or any previous visit (from the past 24 hour(s)).  Patient Active Problem List   Diagnosis Date Noted  . Supervision of other normal pregnancy, antepartum 10/17/2017  . AMA (advanced maternal age) multigravida 35+ 10/17/2017  . Left breast mass 06/10/2013    Assessment/Plan:  Brittany Mejia is a 38 y.o. G3P2002 at [redacted]w[redacted]d here for postdates IOL  #Labor: outer os 1cm but closed internally. Will start with cytotec and plan foley when able #Pain: Per patient request #FWB: Cat 1 #ID: [id number] #MOF: Breas #MOC: husband vasectomy #Circ:  n/a  Rolm Bookbinder, CNM  05/11/2018, 7:39 AM

## 2018-05-11 NOTE — Progress Notes (Signed)
Labor Progress Note Fruma Vieweg is a 38 y.o. G3P2002 at [redacted]w[redacted]d presented for IOL for post dates  S:  Comfortable with epidural, no c/o.   O:  BP 105/69   Pulse 73   Temp 97.7 F (36.5 C) (Oral)   Resp 16   Ht 5\' 1"  (1.549 m)   Wt 70.7 kg   LMP 07/28/2017 (Exact Date)   SpO2 99%   BMI 29.44 kg/m  EFM: baseline 140 bpm/ mod variability/ + accels/ no decels  Toco: 2-4 SVE: Dilation: 5 Effacement (%): 50, 60 Cervical Position: Posterior Station: -2 Presentation: Vertex Exam by:: stone rnc Pitocin: 8 mu/min  A/P: 38 y.o. G3P2002 [redacted]w[redacted]d  1. Labor: latent 2. FWB: Cat I 3. Pain: epidural  Continue Pitocin. Anticipate labor progression and SVD.  Donette Larry, CNM 3:34 PM

## 2018-05-11 NOTE — Anesthesia Procedure Notes (Signed)
Epidural Patient location during procedure: OB Start time: 05/11/2018 1:05 PM End time: 05/11/2018 1:20 PM  Staffing Anesthesiologist: Elmer Picker, MD Performed: anesthesiologist   Preanesthetic Checklist Completed: patient identified, pre-op evaluation, timeout performed, IV checked, risks and benefits discussed and monitors and equipment checked  Epidural Patient position: sitting Prep: site prepped and draped and DuraPrep Patient monitoring: continuous pulse ox, blood pressure, heart rate and cardiac monitor Approach: midline Location: L3-L4 Injection technique: LOR air  Needle:  Needle type: Tuohy  Needle gauge: 17 G Needle length: 9 cm Needle insertion depth: 5 cm Catheter type: closed end flexible Catheter size: 19 Gauge Catheter at skin depth: 10 cm Test dose: negative  Assessment Sensory level: T8 Events: blood not aspirated, injection not painful, no injection resistance, negative IV test and no paresthesia  Additional Notes Patient identified. Risks/Benefits/Options discussed with patient including but not limited to bleeding, infection, nerve damage, paralysis, failed block, incomplete pain control, headache, blood pressure changes, nausea, vomiting, reactions to medication both or allergic, itching and postpartum back pain. Confirmed with bedside nurse the patient's most recent platelet count. Confirmed with patient that they are not currently taking any anticoagulation, have any bleeding history or any family history of bleeding disorders. Patient expressed understanding and wished to proceed. All questions were answered. Sterile technique was used throughout the entire procedure. Please see nursing notes for vital signs. Test dose was given through epidural catheter and negative prior to continuing to dose epidural or start infusion. Warning signs of high block given to the patient including shortness of breath, tingling/numbness in hands, complete motor block,  or any concerning symptoms with instructions to call for help. Patient was given instructions on fall risk and not to get out of bed. All questions and concerns addressed with instructions to call with any issues or inadequate analgesia.  Reason for block:procedure for pain

## 2018-05-11 NOTE — Progress Notes (Signed)
Labor Progress Note Autiana Bault is a 38 y.o. G3P2002 at [redacted]w[redacted]d presented for IOL for postdates  S:  Comfortable, no complaints.   O:  BP 103/70   Pulse 67   Temp 97.7 F (36.5 C) (Oral)   Resp 17   Ht 5\' 1"  (1.549 m)   Wt 70.7 kg   LMP 07/28/2017 (Exact Date)   BMI 29.44 kg/m  EFM: baseline 125 bpm/ mod variability/ + accels/ no decels  Toco: irregular SVE: 1.5/50/-2   A/P: 38 y.o. G3P2002 [redacted]w[redacted]d  1. Labor: latent 2. FWB: Cat I 3. Pain: analgesia/anesthesia prn  Cervical balloon placed, pt tolerated well. Continue Cytotec. Pitocin once foley out. Anticipate labor progression and SVD.  Donette Larry, CNM 10:22 AM

## 2018-05-11 NOTE — Progress Notes (Signed)
OB/GYN Faculty Practice: Labor Progress Note  Subjective: Doing well. Not feeling any contractions but is feeling a little more pressure.   Objective: BP 106/61   Pulse 77   Temp 98.1 F (36.7 C) (Oral)   Resp 16   Ht 5\' 1"  (1.549 m)   Wt 70.7 kg   LMP 07/28/2017 (Exact Date)   SpO2 99%   BMI 29.44 kg/m  Gen: resting comfortably Dilation: 6 Effacement (%): 70 Cervical Position: Posterior Station: -2 Presentation: Vertex Exam by:: dr Mauri Reading  Assessment and Plan: 38 y.o. [id number] [redacted]w[redacted]d here for IOL for postdates  Labor: continue pitocin -- pain control: epidural -- PPH Risk: low  Fetal Well-Being: Cephalic by prior exam.  -- Category 1 - continuous fetal monitoring  -- GBS negative  Burman Nieves, MD Family Medicine Resident 8:55 PM

## 2018-05-12 ENCOUNTER — Encounter (HOSPITAL_COMMUNITY): Payer: Self-pay | Admitting: Obstetrics and Gynecology

## 2018-05-12 DIAGNOSIS — O480 Post-term pregnancy: Secondary | ICD-10-CM

## 2018-05-12 DIAGNOSIS — Z3A41 41 weeks gestation of pregnancy: Secondary | ICD-10-CM

## 2018-05-12 MED ORDER — IBUPROFEN 600 MG PO TABS
600.0000 mg | ORAL_TABLET | Freq: Four times a day (QID) | ORAL | Status: DC
  Administered 2018-05-12 – 2018-05-13 (×6): 600 mg via ORAL
  Filled 2018-05-12 (×6): qty 1

## 2018-05-12 MED ORDER — ACETAMINOPHEN 325 MG PO TABS
650.0000 mg | ORAL_TABLET | ORAL | Status: DC | PRN
  Administered 2018-05-12: 650 mg via ORAL
  Filled 2018-05-12: qty 2

## 2018-05-12 MED ORDER — BENZOCAINE-MENTHOL 20-0.5 % EX AERO
1.0000 "application " | INHALATION_SPRAY | CUTANEOUS | Status: DC | PRN
  Administered 2018-05-12: 1 via TOPICAL
  Filled 2018-05-12: qty 56

## 2018-05-12 MED ORDER — PRENATAL MULTIVITAMIN CH
1.0000 | ORAL_TABLET | Freq: Every day | ORAL | Status: DC
  Administered 2018-05-12: 1 via ORAL
  Filled 2018-05-12 (×2): qty 1

## 2018-05-12 MED ORDER — ONDANSETRON HCL 4 MG/2ML IJ SOLN: 4.0000 mg | INTRAMUSCULAR | Status: DC | PRN

## 2018-05-12 MED ORDER — COCONUT OIL OIL: 1.0000 "application " | TOPICAL_OIL | Status: DC | PRN

## 2018-05-12 MED ORDER — WITCH HAZEL-GLYCERIN EX PADS: 1.0000 "application " | MEDICATED_PAD | CUTANEOUS | Status: DC | PRN

## 2018-05-12 MED ORDER — DIBUCAINE 1 % RE OINT: 1.0000 "application " | TOPICAL_OINTMENT | RECTAL | Status: DC | PRN

## 2018-05-12 MED ORDER — ONDANSETRON HCL 4 MG PO TABS: 4.0000 mg | ORAL_TABLET | ORAL | Status: DC | PRN

## 2018-05-12 MED ORDER — SIMETHICONE 80 MG PO CHEW: 80.0000 mg | CHEWABLE_TABLET | ORAL | Status: DC | PRN

## 2018-05-12 MED ORDER — DIPHENHYDRAMINE HCL 25 MG PO CAPS: 25.0000 mg | ORAL_CAPSULE | Freq: Four times a day (QID) | ORAL | Status: DC | PRN

## 2018-05-12 MED ORDER — TETANUS-DIPHTH-ACELL PERTUSSIS 5-2.5-18.5 LF-MCG/0.5 IM SUSP: 0.5000 mL | Freq: Once | INTRAMUSCULAR | Status: DC

## 2018-05-12 MED ORDER — DOCUSATE SODIUM 100 MG PO CAPS
100.0000 mg | ORAL_CAPSULE | Freq: Two times a day (BID) | ORAL | Status: DC
  Administered 2018-05-12: 100 mg via ORAL
  Filled 2018-05-12: qty 1

## 2018-05-12 MED ORDER — SENNOSIDES-DOCUSATE SODIUM 8.6-50 MG PO TABS
2.0000 | ORAL_TABLET | ORAL | Status: DC
  Administered 2018-05-12: 2 via ORAL
  Filled 2018-05-12: qty 2

## 2018-05-12 NOTE — Lactation Note (Signed)
This note was copied from a baby's chart. Lactation Consultation Note  Patient Name: Brittany Mejia EBXID'[id number] Date: 05/12/2018 Reason for consult: Initial assessment;Term  P3 mother whose infant is now 72 hours old.  Mother breast fed her first child (now 40 years old) for one year and her second child (now 36 years old) for 35 months  Mother was holding baby when I arrived.  Mother had no questions/concerns related to breast feeding.  Encouraged to feed 8-12 times/24 hours or sooner if baby shows cues.  Mother is familiar with hand expression.  Colostrum container provided for any EBM she obtains.  Milk storage times reviewed and finger feeding demonstrated.  Mom made aware of O/P services, breastfeeding support groups, community resources, and our phone # for post-discharge questions. Mother will call for latch assistance if needed.  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  She does not feel a need for a DEBP.     Maternal Data Formula Feeding for Exclusion: No Has patient been taught Hand Expression?: Yes Does the patient have breastfeeding experience prior to this delivery?: Yes  Feeding Feeding Type: Breast Fed  LATCH Score                   Interventions    Lactation Tools Discussed/Used WIC Program: No   Consult Status Consult Status: Follow-up Date: 05/13/18 Follow-up type: In-patient    Dora Sims 05/12/2018, 3:41 PM

## 2018-05-12 NOTE — Discharge Summary (Addendum)
Postpartum Discharge Summary     Patient Name: Brittany Mejia DOB: 12/06/1980 MRN: [id number]  Date of admission: 05/11/2018 Delivering Provider: Burman Nieves A   Date of discharge: 05/12/2018  Admitting diagnosis: pregnancy Intrauterine pregnancy: [redacted]w[redacted]d     Secondary diagnosis:  Active Problems:   AMA (advanced maternal age) multigravida 35+  Additional problems: None     Discharge diagnosis: Term Pregnancy Delivered                                                                                                Post partum procedures:None  Augmentation: AROM, Cytotec and Foley Balloon  Complications: None  Hospital course:  Induction of Labor With Vaginal Delivery   38 y.o. yo G3P2002 at [redacted]w[redacted]d was admitted to the hospital 05/11/2018 for induction of labor.  Indication for induction: Postdates.  Patient had an uncomplicated labor course as follows: Membrane Rupture Time/Date: 6:17 PM ,05/11/2018   Intrapartum Procedures: Episiotomy:                                           Lacerations:  1st degree [2];Perineal [11]  Patient had delivery of a Viable infant.  Information for the patient's newborn:  Tonita, Cabebe Girl Artie [[id number]]  Delivery Method: Vaginal, Spontaneous(Filed from Delivery Summary)   05/11/2018  Details of delivery can be found in separate delivery note.  Patient had a routine postpartum course. Patient is discharged home 05/12/18.  Magnesium Sulfate recieved: No BMZ received: No  Physical exam  Vitals:   05/12/18 1130 05/12/18 1530 05/12/18 2358 05/13/18 0545  BP: (!) 90/55 (!) 98/56 (!) 94/55 99/66  Pulse: (!) 52 62 62 (!) 54  Resp: 16 18 18 18   Temp: 98.3 F (36.8 C) 97.9 F (36.6 C) 97.9 F (36.6 C) 97.8 F (36.6 C)  TempSrc: Oral Oral Oral Oral  SpO2: 99% 99%  100%  Weight:      Height:       General: alert, cooperative and no distress Lochia: appropriate Uterine Fundus: firm Incision: N/A DVT Evaluation: No LE  edema Labs: Lab Results  Component Value Date   WBC 7.3 05/11/2018   HGB 11.9 (L) 05/11/2018   HCT 36.4 05/11/2018   MCV 89.2 05/11/2018   PLT 202 05/11/2018   CMP Latest Ref Rng & Units 06/10/2013  Glucose 70 - 99 mg/dL 76  BUN 6 - 23 mg/dL 12  Creatinine 7.32 - 2.02 mg/dL 5.42  Sodium 706 - 237 mEq/L 136  Potassium 3.5 - 5.3 mEq/L 3.6  Chloride 96 - 112 mEq/L 103  CO2 19 - 32 mEq/L 26  Calcium 8.4 - 10.5 mg/dL 9.0  Total Protein 6.0 - 8.3 g/dL 7.3  Total Bilirubin 0.2 - 1.2 mg/dL 0.5  Alkaline Phos 39 - 117 U/L 73  AST 0 - 37 U/L 15  ALT 0 - 35 U/L 10    Discharge instruction: per After Visit Summary and "Baby and Me Booklet".  After visit meds:  Allergies as of 05/13/2018  No Known Allergies     Medication List    STOP taking these medications   Aspirin Low Dose 81 MG EC tablet Generic drug:  aspirin   hydrocortisone 25 MG suppository Commonly known as:  ANUSOL-HC     TAKE these medications   ibuprofen 600 MG tablet Commonly known as:  ADVIL,MOTRIN Take 1 tablet (600 mg total) by mouth every 6 (six) hours.   PrePLUS 27-1 MG Tabs Take 1 tablet by mouth daily.       Diet: routine diet  Activity: Advance as tolerated. Pelvic rest for 6 weeks.   Outpatient follow up:6 weeks Follow up Appt:No future appointments. Follow up Visit:  Please schedule this patient for Postpartum visit in: 6 weeks with the following provider: Any provider For C/S patients schedule nurse incision check in weeks 2 weeks: no Low risk pregnancy complicated by: AMA Delivery mode:  SVD Anticipated Birth Control:  Partner vasectomy PP Procedures needed: None  Schedule Integrated BH visit: no  Newborn Data: Live born female  Birth Weight: 7 lb 12.3 oz (3524 g) APGAR: 9, 9  Newborn Delivery   Birth date/time:  05/11/2018 23:50:00 Delivery type:  Vaginal, Spontaneous    Baby Feeding: Breast Disposition:home with mother  Burman Nieves, MD Faculty Service, Resident   I  confirm that I have verified the information documented in the resident's note and that I have also personally reperformed the physical exam and all medical decision making activities. Patient was seen and examined by me also Agree with note Vitals stable Labs stable Fundus firm, lochia within normal limits Perineum healing Ext WNL  Ready for discharge  Aviva Signs, CNM

## 2018-05-12 NOTE — Progress Notes (Signed)
Post Partum Day 1 Subjective: no complaints, up ad lib, voiding and tolerating PO  Objective: Blood pressure 101/63, pulse 67, temperature 98.5 F (36.9 C), temperature source Oral, resp. rate 16, height 5\' 1"  (1.549 m), weight 70.7 kg, last menstrual period 07/28/2017, SpO2 98 %, unknown if currently breastfeeding.  Physical Exam:  General: alert, cooperative and no distress Lochia: appropriate Uterine Fundus: firm DVT Evaluation: No cords or calf tenderness. No significant calf/ankle edema.  Recent Labs    05/11/18 0743  HGB 11.9*  HCT 36.4   Assessment/Plan: Plan for discharge tomorrow, Breastfeeding and Contraception vasectomy  - continue routine postpartum care   LOS: 1 day    Burman Nieves, MD Family Medicine Resident  05/12/2018, 6:59 AM

## 2018-05-13 MED ORDER — IBUPROFEN 600 MG PO TABS: 600.0000 mg | ORAL_TABLET | Freq: Four times a day (QID) | ORAL | 0 refills | Status: DC

## 2018-05-13 NOTE — Lactation Note (Signed)
This note was copied from a baby's chart. Lactation Consultation Note  Patient Name: Brittany Mejia [id number]'[id number] Date: 05/13/2018 Reason for consult: Follow-up assessment;Term  P3 mother whose infant is now 58 hours old.  Mother breast fed her first child (now 40 years old) for one year and her second child (now 42 years old) for 7 months.  Mother was resting in bed with baby when I arrived.  She had no further questions/concerns related to breast feeding.  Her nipples are a little tender so I provided coconut oil with instructions.  Baby has been showing feeding cues and mother denies pain with latching.  Engorgement prevention/treatment reviewed.  Manual pump provided.  Mother stated she was familiar with the pump and did not need any review.  She does not have a DEBP for home use but does not feel a need to obtain one.  Mother has our OP phone number for questions/concerns after discharge.   Maternal Data Formula Feeding for Exclusion: No Has patient been taught Hand Expression?: Yes Does the patient have breastfeeding experience prior to this delivery?: Yes  Feeding    LATCH Score                   Interventions    Lactation Tools Discussed/Used     Consult Status Consult Status: Complete Date: 05/13/18 Follow-up type: Call as needed    Brittany Mejia 05/13/2018, 8:42 AM

## 2018-05-13 NOTE — Anesthesia Postprocedure Evaluation (Signed)
Anesthesia Post Note  Patient: Brittany Mejia  Procedure(s) Performed: AN AD HOC LABOR EPIDURAL     Patient location during evaluation: Mother Baby Anesthesia Type: Epidural Level of consciousness: awake and alert Pain management: pain level controlled Vital Signs Assessment: post-procedure vital signs reviewed and stable Respiratory status: spontaneous breathing, nonlabored ventilation and respiratory function stable Cardiovascular status: stable Postop Assessment: no headache, no backache and epidural receding Anesthetic complications: no Comments: Spoke with Patient via phone interview. She said everything was good, no questions or concerns expressed.    Last Vitals:  Vitals:   05/12/18 2358 05/13/18 0545  BP: (!) 94/55 99/66  Pulse: 62 (!) 54  Resp: 18 18  Temp: 36.6 C 36.6 C  SpO2:  100%    Last Pain:  Vitals:   05/13/18 0545  TempSrc: Oral  PainSc:    Pain Goal: Patients Stated Pain Goal: 3 (05/12/18 1747)                 Junious Silk

## 2018-05-13 NOTE — Discharge Instructions (Signed)

## 2018-05-14 ENCOUNTER — Ambulatory Visit: Payer: Self-pay

## 2018-05-14 NOTE — Lactation Note (Signed)
This note was copied from a baby's chart. Lactation Consultation Note  Patient Name: Girl Chandy Vanscoyk PQDIY'M Date: 05/14/2018 Reason for consult: Follow-up assessment;Infant weight loss Baby is 57 hours old/11 % weight loss.  Mom feels like baby is latching well but becomes sleepy at breast.  She reports a good milk supply with previous babies.  Breasts are becoming full and milk is easily hand expressed.  Baby has been cluster feeding.  Observed baby latch easily to breast but after a few minutes only non-nutritive sucking.  Instructed to use good breast massage/compression during feeding.  Symphony pump set up and initiated.  Plan is to give expressed breast milk to baby for additional calories.  Mom pumped 4 mls which was given to baby with curved tip syringe.  Pediatrician has ordered baby to receive formula supplementation in addition to expressed milk.  RN assisted mom and baby took 20 mls of formula with curved tip syringe.  Baby content after supplement.  Maternal Data    Feeding Feeding Type: Breast Fed  LATCH Score Latch: Grasps breast easily, tongue down, lips flanged, rhythmical sucking.  Audible Swallowing: A few with stimulation  Type of Nipple: Everted at rest and after stimulation  Comfort (Breast/Nipple): Soft / non-tender  Hold (Positioning): No assistance needed to correctly position infant at breast.  LATCH Score: 9  Interventions    Lactation Tools Discussed/Used Pump Review: Setup, frequency, and cleaning;Milk Storage Initiated by:: LMoulden Date initiated:: 05/14/18   Consult Status      Rock Nephew S 05/14/2018, 9:20 AM

## 2018-06-25 ENCOUNTER — Other Ambulatory Visit: Payer: Self-pay

## 2018-06-25 ENCOUNTER — Encounter: Payer: Medicaid Other | Admitting: Obstetrics and Gynecology

## 2018-06-25 ENCOUNTER — Ambulatory Visit: Payer: Self-pay | Admitting: Obstetrics and Gynecology

## 2018-06-25 NOTE — Progress Notes (Signed)
0901::Attempted to contact pt regarding her appointment.  Pt did not pick up.  Left voicemail requesting pt call the clinic and stating that she would be contacted again in 15 minutes.   7622:: attempted to contact pt regarding her appointment. Pt did not pick up.  Left voicemail requesting pt call the clinic.

## 2018-07-08 ENCOUNTER — Other Ambulatory Visit: Payer: Self-pay

## 2018-07-08 ENCOUNTER — Ambulatory Visit (INDEPENDENT_AMBULATORY_CARE_PROVIDER_SITE_OTHER): Payer: Medicaid Other

## 2018-07-08 NOTE — Progress Notes (Signed)
Breastfeeding only.  Vaginal delivery on 05/12/18 with epidural.  Pt refuses BC at this time.  Pt does not access to a BP cuff.

## 2018-07-08 NOTE — Progress Notes (Signed)
TELEHEALTH VIRTUAL POSTPARTUM VISIT ENCOUNTER NOTE  I connected with Brittany Mejia on 07/08/18 at 10:15 AM EDT by MyChart Video Visit at home and verified that I am speaking with the correct person using two identifiers.   I discussed the limitations, risks, security and privacy concerns of performing an evaluation and management service by telephone and the availability of in person appointments. I also discussed with the patient that there may be a patient responsible charge related to this service. The patient expressed understanding and agreed to proceed.  Appointment Date: 07/08/2018  [facility]  Chief Complaint:  Chief Complaint  Patient presents with  . Postpartum Care    History of Present Illness: Brittany Mejia is a 38 y.o. Hispanic [phone] (Patient's last menstrual period was 07/28/2017 (exact date).), seen for the above chief complaint. Her past medical history is significant for n/a   She is s/p normal spontaneous vaginal delivery on 3/24 at 41 weeks; she was discharged to home on PPD#1. Pregnancy complicated by AMA. Baby is doing well.  Complains of n/a  Vaginal bleeding or discharge: No  Mode of feeding infant: Breast Intercourse: No  Contraception: no method PP depression s/s: No .  Any bowel or bladder issues: No  Pap smear: no abnormalities (date: 2015)  Review of Systems: Positive for n/a. Her 12 point review of systems is negative or as noted in the History of Present Illness.  Patient Active Problem List   Diagnosis Date Noted  . ERRONEOUS ENCOUNTER--DISREGARD 06/25/2018  . Supervision of other normal pregnancy, antepartum 10/17/2017  . AMA (advanced maternal age) multigravida 35+ 10/17/2017  . Left breast mass 06/10/2013    Medications Brittany Mejia had no medications administered during this visit. Current Outpatient Medications  Medication Sig Dispense Refill  . ibuprofen (ADVIL,MOTRIN) 600 MG tablet Take 1 tablet  (600 mg total) by mouth every 6 (six) hours. 30 tablet 0  . Prenatal Vit-Fe Fumarate-FA (PREPLUS) 27-1 MG TABS Take 1 tablet by mouth daily. 30 tablet 6   No current facility-administered medications for this visit.     Allergies Patient has no known allergies.  Physical Exam:  General:  Alert, oriented and cooperative.   Mental Status: Normal mood and affect perceived. Normal judgment and thought content.  Rest of physical exam deferred due to type of encounter  PP Depression Screening:   Edinburgh Postnatal Depression Scale - 07/08/18 1016      Edinburgh Postnatal Depression Scale:  In the Past 7 Days   I have been able to laugh and see the funny side of things.  0    I have looked forward with enjoyment to things.  0    I have blamed myself unnecessarily when things went wrong.  0    I have been anxious or worried for no good reason.  0    I have felt scared or panicky for no good reason.  0    Things have been getting on top of me.  0    I have been so unhappy that I have had difficulty sleeping.  0    I have felt sad or miserable.  0    I have been so unhappy that I have been crying.  0    The thought of harming myself has occurred to me.  0    Edinburgh Postnatal Depression Scale Total  0       Assessment:Patient is a 38 y.o. [id number] who is 6 weeks postpartum from a  normal spontaneous vaginal delivery.  She is doing well.   Plan:  1. Postpartum care and examination -Patient doing well PP -Planning vasectomy for contraception -Due [facility]   RTC as needed for gyn care  I discussed the assessment and treatment plan with the patient. The patient was provided an opportunity to ask questions and all were answered. The patient agreed with the plan and demonstrated an understanding of the instructions.   The patient was advised to call back or seek an in-person evaluation/go to the ED for any concerning postpartum symptoms.  I  provided 15 minutes of non-face-to-face time during this encounter.   Rolm Bookbinderaroline M Neill, CNM [facility]

## 2018-08-13 ENCOUNTER — Other Ambulatory Visit: Payer: Self-pay | Admitting: Obstetrics & Gynecology

## 2019-01-22 ENCOUNTER — Other Ambulatory Visit: Payer: Self-pay

## 2019-01-22 ENCOUNTER — Telehealth: Payer: Self-pay

## 2019-01-22 ENCOUNTER — Encounter (INDEPENDENT_AMBULATORY_CARE_PROVIDER_SITE_OTHER): Payer: Self-pay

## 2019-01-22 ENCOUNTER — Telehealth: Payer: Medicaid Other | Admitting: Physician Assistant

## 2019-01-22 DIAGNOSIS — Z20822 Contact with and (suspected) exposure to covid-19: Secondary | ICD-10-CM

## 2019-01-22 NOTE — Telephone Encounter (Signed)
Patient advised on sob per protocol:   IF PATIENT HAS WORSENING WEAKNESS WITH INABILITY TO STAND OR IF PATIENT HAS TO HOLD ON TO SOMETHING TO GET BALANCE, ADVISE PATIENT TO CALL 911 AND SEEK TREATMENT IN ED   Patient just feels fatigue but not having severe symptoms

## 2019-01-22 NOTE — Progress Notes (Signed)
E-Visit for Corona Virus Screening   Your current symptoms could be consistent with the coronavirus.  Many health care providers can now test patients at their office but not all are.  [place] has multiple testing sites. For information on our COVID testing locations and hours go to [email or web address]  Please quarantine yourself while awaiting your test results.  We are enrolling you in our MyChart Home Montioring for COVID19 . Daily you will receive a questionnaire within the MyChart website. Our COVID 19 response team willl be monitoriing your responses daily. Please continue good preventive care measures, including:  frequent hand-washing, avoid touching your face, cover coughs/sneezes, stay out of crowds and keep a 6 foot distance from others.    COVID-19 is a respiratory illness with symptoms that are similar to the flu. Symptoms are typically mild to moderate, but there have been cases of severe illness and death due to the virus. The following symptoms may appear 2-14 days after exposure: . Fever . Cough . Shortness of breath or difficulty breathing . Chills . Repeated shaking with chills . Muscle pain . Headache . Sore throat . New loss of taste or smell . Fatigue . Congestion or runny nose . Nausea or vomiting . Diarrhea  If you develop fever/cough/breathlessness, please stay home for 10 days with improving symptoms and until you have had 24 hours of no fever (without taking a fever reducer).  Go to the nearest hospital ED for assessment if fever/cough/breathlessness are severe or illness seems like a threat to life.  It is vitally important that if you feel that you have an infection such as this virus or any other virus that you stay home and away from places where you may spread it to others.  You should avoid contact with people age 65 and older.   You should wear a mask or cloth face covering over your nose and mouth if you must be around other  people or animals, including pets (even at home). Try to stay at least 6 feet away from other people. This will protect the people around you.   You may also take acetaminophen (Tylenol) as needed for fever.   Reduce your risk of any infection by using the same precautions used for avoiding the common cold or flu:  . Wash your hands often with soap and warm water for at least 20 seconds.  If soap and water are not readily available, use an alcohol-based hand sanitizer with at least 60% alcohol.  . If coughing or sneezing, cover your mouth and nose by coughing or sneezing into the elbow areas of your shirt or coat, into a tissue or into your sleeve (not your hands). . Avoid shaking hands with others and consider head nods or verbal greetings only. . Avoid touching your eyes, nose, or mouth with unwashed hands.  . Avoid close contact with people who are sick. . Avoid places or events with large numbers of people in one location, like concerts or sporting events. . Carefully consider travel plans you have or are making. . If you are planning any travel outside or inside the [place], visit the CDC's Travelers' Health webpage for the latest health notices. . If you have some symptoms but not all symptoms, continue to monitor at home and seek medical attention if your symptoms worsen. . If you are having a medical emergency, call 911.  HOME CARE . Only take medications as instructed by your medical team. . Drink plenty of fluids and   get plenty of rest. . A steam or ultrasonic humidifier can help if you have congestion.   GET HELP RIGHT AWAY IF YOU HAVE EMERGENCY WARNING SIGNS** FOR COVID-19. If you or someone is showing any of these signs seek emergency medical care immediately. Call 911 or proceed to your closest emergency facility if: . You develop worsening high fever. . Trouble breathing . Bluish lips or face . Persistent pain or pressure in the chest . New confusion . Inability to wake or stay  awake . You cough up blood. . Your symptoms become more severe  **This list is not all possible symptoms. Contact your medical provider for any symptoms that are sever or concerning to you.   MAKE SURE YOU   Understand these instructions.  Will watch your condition.  Will get help right away if you are not doing well or get worse.  Your e-visit answers were reviewed by a board certified advanced clinical practitioner to complete your personal care plan.  Depending on the condition, your plan could have included both over the counter or prescription medications.  If there is a problem please reply once you have received a response from your provider.  Your safety is important to us.  If you have drug allergies check your prescription carefully.    You can use MyChart to ask questions about today's visit, request a non-urgent call back, or ask for a work or school excuse for 24 hours related to this e-Visit. If it has been greater than 24 hours you will need to follow up with your provider, or enter a new e-Visit to address those concerns. You will get an e-mail in the next two days asking about your experience.  I hope that your e-visit has been valuable and will speed your recovery. Thank you for using e-visits.    

## 2019-01-22 NOTE — Telephone Encounter (Signed)
Spoke to patient and advise

## 2019-01-22 NOTE — Telephone Encounter (Signed)
Left a vm for patient to callback regarding mychart questionnaire  

## 2019-01-22 NOTE — Telephone Encounter (Signed)
Patient returned call,would like a call back 

## 2019-01-22 NOTE — Progress Notes (Signed)
I have spent 5 minutes in review of e-visit questionnaire, review and updating patient chart, medical decision making and response to patient.   Mckaylee Dimalanta Cody Nolah Krenzer, PA-C    

## 2019-01-24 LAB — NOVEL CORONAVIRUS, NAA: SARS-CoV-2, NAA: DETECTED — AB

## 2019-03-24 ENCOUNTER — Other Ambulatory Visit: Payer: Self-pay

## 2019-03-24 ENCOUNTER — Other Ambulatory Visit (HOSPITAL_COMMUNITY)
Admission: RE | Admit: 2019-03-24 | Discharge: 2019-03-24 | Disposition: A | Discharge status: Home / Self Care | Payer: Medicaid Other | Source: Ambulatory Visit | Attending: Obstetrics | Admitting: Obstetrics

## 2019-03-24 ENCOUNTER — Encounter: Payer: Self-pay | Admitting: Obstetrics

## 2019-03-24 ENCOUNTER — Ambulatory Visit: Payer: Medicaid Other | Admitting: Obstetrics

## 2019-03-24 VITALS — BP 95/56 | HR 70 | Ht 61.0 in | Wt 124.6 lb

## 2019-03-24 DIAGNOSIS — Z0000 Encounter for general adult medical examination without abnormal findings: Secondary | ICD-10-CM

## 2019-03-24 DIAGNOSIS — Z3009 Encounter for other general counseling and advice on contraception: Secondary | ICD-10-CM

## 2019-03-24 DIAGNOSIS — N898 Other specified noninflammatory disorders of vagina: Secondary | ICD-10-CM | POA: Insufficient documentation

## 2019-03-24 DIAGNOSIS — Z01419 Encounter for gynecological examination (general) (routine) without abnormal findings: Secondary | ICD-10-CM

## 2019-03-24 DIAGNOSIS — Z113 Encounter for screening for infections with a predominantly sexual mode of transmission: Secondary | ICD-10-CM

## 2019-03-24 NOTE — Progress Notes (Signed)
New patient is in the office for GYN visit. Last pap 06-10-13. Last mammogram 09-01-15. Patient is currently not on any BC and states that she does not want any at this time, LMP 03-14-19.  Currently breastfeeding, last delivery 05-11-18.

## 2019-03-24 NOTE — Progress Notes (Signed)
Subjective:        Brittany Mejia is a 40 y.o. female here for a routine exam.  Current complaints: None.    Personal health questionnaire:  Is patient Ashkenazi Jewish, have a family history of breast and/or ovarian cancer: no Is there a family history of uterine cancer diagnosed at age < 28, gastrointestinal cancer, urinary tract cancer, family member who is a Personnel officer syndrome-associated carrier: no Is the patient overweight and hypertensive, family history of diabetes, personal history of gestational diabetes, preeclampsia or PCOS: no Is patient over 17, have PCOS,  family history of premature CHD under age 55, diabetes, smoke, have hypertension or peripheral artery disease:  no At any time, has a partner hit, kicked or otherwise hurt or frightened you?: no Over the past 2 weeks, have you felt down, depressed or hopeless?: no Over the past 2 weeks, have you felt little interest or pleasure in doing things?:no   Gynecologic History Patient's last menstrual period was 03/14/2019. Contraception: none Last Pap: 06-10-2013. Results were: normal Last mammogram: 09-01-2015. Results were: normal  Obstetric History OB History  Gravida Para Term Preterm AB Living  3 3 3  0 0 3  SAB TAB Ectopic Multiple Live Births        0 3    # Outcome Date GA Lbr Len/2nd Weight Sex Delivery Anes PTL Lv  3 Term 05/11/18 [redacted]w[redacted]d 04:10 / 01:23 7 lb 12.3 oz (3.524 kg) F Vag-Spont EPI  LIV     Birth Comments: n/a  2 Term 06/08/10 [redacted]w[redacted]d  8 lb (3.629 kg) M Vag-Spont   LIV  1 Term 01/04/03 [redacted]w[redacted]d  6 lb 13 oz (3.09 kg) F Vag-Spont  N LIV    Past Medical History:  Diagnosis Date  . Headache     Past Surgical History:  Procedure Laterality Date  . APPENDECTOMY     Performed in [redacted]w[redacted]d at age 50     Current Outpatient Medications:  .  Prenatal 27-1 MG TABS, TAKE 1 TABLET BY MOUTH EVERY DAY, Disp: 30 tablet, Rfl: 6 .  ibuprofen (ADVIL,MOTRIN) 600 MG tablet, Take 1 tablet (600 mg total) by mouth every  6 (six) hours. (Patient not taking: Reported on 03/24/2019), Disp: 30 tablet, Rfl: 0 No Known Allergies  Social History   Tobacco Use  . Smoking status: Never Smoker  . Smokeless tobacco: Never Used  Substance Use Topics  . Alcohol use: Not Currently    Comment: OCC    Family History  Problem Relation Age of Onset  . Diabetes Mother   . Hypertension Mother       Review of Systems  Constitutional: negative for fatigue and weight loss Respiratory: negative for cough and wheezing Cardiovascular: negative for chest pain, fatigue and palpitations Gastrointestinal: negative for abdominal pain and change in bowel habits Musculoskeletal:negative for myalgias Neurological: negative for gait problems and tremors Behavioral/Psych: negative for abusive relationship, depression Endocrine: negative for temperature intolerance    Genitourinary:negative for abnormal menstrual periods, genital lesions, hot flashes, sexual problems and vaginal discharge Integument/breast: negative for breast lump, breast tenderness, nipple discharge and skin lesion(s)    Objective:       BP (!) 95/56   Pulse 70   Ht 5\' 1"  (1.549 m)   Wt 124 lb 9.6 oz (56.5 kg)   LMP 03/14/2019   Breastfeeding Yes   BMI 23.54 kg/m  General:   alert  Skin:   no rash or abnormalities  Lungs:   clear to auscultation bilaterally  Heart:   regular rate and rhythm, S1, S2 normal, no murmur, click, rub or gallop  Breasts:   normal without suspicious masses, skin or nipple changes or axillary nodes  Abdomen:  normal findings: no organomegaly, soft, non-tender and no hernia  Pelvis:  External genitalia: normal general appearance Urinary system: urethral meatus normal and bladder without fullness, nontender Vaginal: normal without tenderness, induration or masses Cervix: normal appearance Adnexa: normal bimanual exam Uterus: anteverted and non-tender, normal size   Lab Review Urine pregnancy test Labs reviewed  yes Radiologic studies reviewed yes  50% of 25 min visit spent on counseling and coordination of care.   Assessment:    Healthy female exam.    Plan:    Education reviewed: calcium supplements, depression evaluation, low fat, low cholesterol diet, safe sex/STD prevention, self breast exams and weight bearing exercise. Contraception: vasectomy. Follow up in: 1 year.   No orders of the defined types were placed in this encounter.  Orders Placed This Encounter  Procedures  . HIV antibody (with reflex)  . RPR  . Hepatitis B Surface AntiGEN  . Hepatitis C Antibody    Shelly Bombard, MD 03/24/2019 9:45 AM

## 2019-03-25 LAB — CYTOLOGY - PAP
Comment: NEGATIVE
Diagnosis: NEGATIVE
High risk HPV: NEGATIVE

## 2019-03-25 LAB — CERVICOVAGINAL ANCILLARY ONLY
Bacterial Vaginitis (gardnerella): NEGATIVE
Candida Glabrata: NEGATIVE
Candida Vaginitis: NEGATIVE
Chlamydia: NEGATIVE
Comment: NEGATIVE
Comment: NEGATIVE
Comment: NEGATIVE
Comment: NEGATIVE
Comment: NEGATIVE
Comment: NORMAL
Neisseria Gonorrhea: NEGATIVE
Trichomonas: NEGATIVE

## 2019-03-26 LAB — HEPATITIS C ANTIBODY: Hep C Virus Ab: <0.1 s/co ratio (ref 0.0–0.9)

## 2019-03-26 LAB — HEPATITIS B SURFACE ANTIGEN: Hepatitis B Surface Ag: NEGATIVE

## 2019-03-26 LAB — HIV ANTIBODY (ROUTINE TESTING W REFLEX): HIV Screen 4th Generation wRfx: NONREACTIVE

## 2019-03-26 LAB — RPR: RPR Ser Ql: NONREACTIVE

## 2019-10-06 ENCOUNTER — Other Ambulatory Visit: Payer: Self-pay | Admitting: *Deleted

## 2019-10-06 MED ORDER — PRENATAL 27-1 MG PO TABS: 1.0000 | ORAL_TABLET | Freq: Every day | ORAL | 6 refills | Status: DC

## 2019-10-06 NOTE — Progress Notes (Signed)
Refill on PNV sent today.

## 2022-04-26 ENCOUNTER — Ambulatory Visit (INDEPENDENT_AMBULATORY_CARE_PROVIDER_SITE_OTHER): Payer: Medicaid Other | Admitting: Family Medicine

## 2022-04-26 ENCOUNTER — Encounter: Payer: Self-pay | Admitting: Family Medicine

## 2022-04-26 VITALS — BP 97/75 | HR 73 | Ht 62.0 in | Wt 132.2 lb

## 2022-04-26 DIAGNOSIS — R591 Generalized enlarged lymph nodes: Secondary | ICD-10-CM | POA: Diagnosis present

## 2022-04-26 DIAGNOSIS — R599 Enlarged lymph nodes, unspecified: Secondary | ICD-10-CM | POA: Insufficient documentation

## 2022-04-26 DIAGNOSIS — Z0000 Encounter for general adult medical examination without abnormal findings: Secondary | ICD-10-CM | POA: Diagnosis not present

## 2022-04-26 NOTE — Patient Instructions (Signed)
It was wonderful to see you today.  Please bring ALL of your medications with you to every visit.   Today we talked about:  Your overall health- you are due for your next mammogram in June.   Lymphadenopathy - I have referred you to Rheumatology. And I will follow up with your lab results that we have done today.   Please follow up as needed or in one year.   Thank you for choosing [facility].   Please call [phone] with any questions about today's appointment.  Please be sure to schedule follow up at the front desk before you leave today.   Lowry Ram, MD  Family Medicine

## 2022-04-26 NOTE — Progress Notes (Signed)
New Patient Visit  HPI:  Patient presents today for a new patient appointment to establish general primary care.  Prior PCP: None, last seen 2021   Other care team members: None  Concerns today: Enlarged lymph node at the dentist.  - Dentist noted an enlarged lymph node last month. Patient did not have any pain or URI type symptoms at the time. Dentist also noted enlarged thyroid on left side. Patient reported that blood testing was done and she had a + ANA. Patient notes that last year she had two instances with fever with sweating through her pajamas. Last instance was in November. She did not have any other symptoms at this time. She had negative covid tests at this time.  Her mother had T1DM, and patient says she got very sick and no one could figure out why.  No family history of other autoimmune conditions that she knows of.  No unintentional weight loss. No out of country contacts. Some hand joint pain 2-3 a week. No joint swelling or erythema.  No rashes.  Patient wants to know which autoimmune condition she has to know which foods she should avoid.  She says that she avoids gluten as she notices it makes her feel more bloated.  She is worried that she has an autoimmune condition, especially since her ANA was positive.  ANA was positive with nuclear speckled, 1:80 titer.   Past Medical Hx:  -None  Past Surgical Hx:  -Appendectomy at age 56   Family Hx: updated in Epic No family history of colon cancer  Mom with T1DM   Social Hx:  - occupation: Art therapist  - lives with: 3 children and husband  - tobacco: none - alcohol: none - drugs: none - Natural supplements: oregano grape, one to drain lymph, glutathione, magnesium, potassium, selenium   Health Maintenance:  -Up to date on mammogram, pap smears  Next due for mammogram in 07/2022   PHYSICAL EXAM: BP 97/75   Pulse 73   Ht '5\' 2"'$  (1.575 m)   Wt 132 lb 3.2 oz (60 kg)   LMP 04/23/2022 (Exact Date)   SpO2 100%    BMI 24.18 kg/m  Gen: Well appearing, in no acute distress HEENT: One palpable lymph node or enlarged submandibular gland on left side. No drainage, edema or erythema visible in oropharynx. No other cervical, subclavicular, postauricular, or axillary lymphadenopathy  Heart: RRR, cap refill < 2 seconds, radial pulses equal and symmetric  Lungs: Normal work of breathing, CTAB Abdomen: Soft, non tender, non distended Neuro: Alert and oriented Derm: No abnormalities with any joints, no nodules, papules, or rash  ASSESSMENT/PLAN:  Health maintenance:  -Mammogram due in 07/2022  - Lipid panel normal on labs patient presented will send on mychart  - CMP today    Enlarged lymph node Does not appear to have high risk for autoimmune condition. Has not had recent B symptoms, no rash, joint problems. Positive ANA is very common and seems to not indicate an autoimmune condition at this time. I believe enlarged lymph node could be due to viral process, or could be enlarged submandibular gland. Patient does have family history of T1DM. Patient also takes many supplements that are not FDA regulated. Lymphadenopathy could be caused by a compound in the supplement. Counseled regarding risks and benefits of non regulated supplements.  - CBC - TSH  - CMP  - Refer to rheumatology as requested by patient.      FOLLOW UP: Follow up for annual  physical next year or earlier if other needs arise.   Lowry Ram, MD [place]

## 2022-04-26 NOTE — Assessment & Plan Note (Signed)
Does not appear to have high risk for autoimmune condition. Has not had recent B symptoms, no rash, joint problems. Positive ANA is very common and seems to not indicate an autoimmune condition at this time. I believe enlarged lymph node could be due to viral process, or could be enlarged submandibular gland. Patient does have family history of T1DM. Patient also takes many supplements that are not FDA regulated. Lymphadenopathy could be caused by a compound in the supplement. Counseled regarding risks and benefits of non regulated supplements.  - CBC - TSH  - CMP  - Refer to rheumatology as requested by patient.

## 2022-04-26 NOTE — Progress Notes (Signed)
e

## 2022-04-27 LAB — COMPREHENSIVE METABOLIC PANEL
ALT: 13 IU/L (ref 0–32)
AST: 14 IU/L (ref 0–40)
Albumin/Globulin Ratio: 1.5 (ref 1.2–2.2)
Albumin: 4.4 g/dL (ref 3.9–4.9)
Alkaline Phosphatase: 66 IU/L (ref 44–121)
BUN/Creatinine Ratio: 20 (ref 9–23)
BUN: 14 mg/dL (ref 6–24)
Bilirubin Total: 0.3 mg/dL (ref 0.0–1.2)
CO2: 23 mmol/L (ref 20–29)
Calcium: 9.2 mg/dL (ref 8.7–10.2)
Chloride: 102 mmol/L (ref 96–106)
Creatinine, Ser: 0.7 mg/dL (ref 0.57–1.00)
Globulin, Total: 3 g/dL (ref 1.5–4.5)
Glucose: 115 mg/dL — ABNORMAL HIGH (ref 70–99)
Potassium: 3.6 mmol/L (ref 3.5–5.2)
Sodium: 139 mmol/L (ref 134–144)
Total Protein: 7.4 g/dL (ref 6.0–8.5)
eGFR: 110 mL/min/{1.73_m2} (ref >59)

## 2022-04-27 LAB — CBC WITH DIFFERENTIAL/PLATELET
Basophils Absolute: 0 10*3/uL (ref 0.0–0.2)
Basos: 1 %
EOS (ABSOLUTE): 0.1 10*3/uL (ref 0.0–0.4)
Eos: 3 %
Hematocrit: 35.6 % (ref 34.0–46.6)
Hemoglobin: 12.1 g/dL (ref 11.1–15.9)
Immature Grans (Abs): 0 10*3/uL (ref 0.0–0.1)
Immature Granulocytes: 0 %
Lymphocytes Absolute: 1.9 10*3/uL (ref 0.7–3.1)
Lymphs: 42 %
MCH: 30.1 pg (ref 26.6–33.0)
MCHC: 34 g/dL (ref 31.5–35.7)
MCV: 89 fL (ref 79–97)
Monocytes Absolute: 0.3 10*3/uL (ref 0.1–0.9)
Monocytes: 6 %
Neutrophils Absolute: 2.2 10*3/uL (ref 1.4–7.0)
Neutrophils: 48 %
Platelets: 270 10*3/uL (ref 150–450)
RBC: 4.02 x10E6/uL (ref 3.77–5.28)
RDW: 12.4 % (ref 11.7–15.4)
WBC: 4.5 10*3/uL (ref 3.4–10.8)

## 2022-04-27 LAB — TSH RFX ON ABNORMAL TO FREE T4: TSH: 1.43 u[IU]/mL (ref 0.450–4.500)

## 2022-04-30 NOTE — Progress Notes (Signed)
Patient's glucose is a little elevated would like to check an A1c for diabetes at next visit.

## 2022-05-09 DIAGNOSIS — Z01411 Encounter for gynecological examination (general) (routine) with abnormal findings: Secondary | ICD-10-CM | POA: Diagnosis not present

## 2022-05-09 DIAGNOSIS — Z3009 Encounter for other general counseling and advice on contraception: Secondary | ICD-10-CM | POA: Diagnosis not present

## 2022-05-09 DIAGNOSIS — N6322 Unspecified lump in the left breast, upper inner quadrant: Secondary | ICD-10-CM | POA: Diagnosis not present

## 2022-05-29 ENCOUNTER — Encounter: Payer: Medicaid Other | Admitting: Obstetrics and Gynecology

## 2022-06-12 ENCOUNTER — Other Ambulatory Visit: Payer: Self-pay

## 2022-06-12 ENCOUNTER — Emergency Department (HOSPITAL_COMMUNITY)
Admission: EM | Admit: 2022-06-12 | Discharge: 2022-06-12 | Disposition: A | Discharge status: Home / Self Care | Payer: BLUE CROSS/BLUE SHIELD | Attending: Emergency Medicine | Admitting: Emergency Medicine

## 2022-06-12 DIAGNOSIS — S0001XA Abrasion of scalp, initial encounter: Secondary | ICD-10-CM | POA: Insufficient documentation

## 2022-06-12 DIAGNOSIS — S0990XA Unspecified injury of head, initial encounter: Secondary | ICD-10-CM

## 2022-06-12 DIAGNOSIS — Y92410 Unspecified street and highway as the place of occurrence of the external cause: Secondary | ICD-10-CM | POA: Diagnosis not present

## 2022-06-12 DIAGNOSIS — M542 Cervicalgia: Secondary | ICD-10-CM | POA: Diagnosis not present

## 2022-06-12 DIAGNOSIS — R519 Headache, unspecified: Secondary | ICD-10-CM | POA: Diagnosis not present

## 2022-06-12 NOTE — Discharge Instructions (Signed)
Use Tylenol or ibuprofen every 6 as needed for pain. Use heat or ice as tolerated. Return for vomiting, lethargy or new concerns.
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
## 2022-07-02 DIAGNOSIS — M9901 Segmental and somatic dysfunction of cervical region: Secondary | ICD-10-CM | POA: Diagnosis not present

## 2022-07-02 DIAGNOSIS — S161XXA Strain of muscle, fascia and tendon at neck level, initial encounter: Secondary | ICD-10-CM | POA: Diagnosis not present

## 2022-07-02 DIAGNOSIS — S335XXA Sprain of ligaments of lumbar spine, initial encounter: Secondary | ICD-10-CM | POA: Diagnosis not present

## 2022-07-02 DIAGNOSIS — S233XXA Sprain of ligaments of thoracic spine, initial encounter: Secondary | ICD-10-CM | POA: Diagnosis not present

## 2022-07-04 DIAGNOSIS — S161XXA Strain of muscle, fascia and tendon at neck level, initial encounter: Secondary | ICD-10-CM | POA: Diagnosis not present

## 2022-07-04 DIAGNOSIS — S233XXA Sprain of ligaments of thoracic spine, initial encounter: Secondary | ICD-10-CM | POA: Diagnosis not present

## 2022-07-04 DIAGNOSIS — S335XXA Sprain of ligaments of lumbar spine, initial encounter: Secondary | ICD-10-CM | POA: Diagnosis not present

## 2022-07-04 DIAGNOSIS — M9901 Segmental and somatic dysfunction of cervical region: Secondary | ICD-10-CM | POA: Diagnosis not present

## 2022-07-10 DIAGNOSIS — S335XXA Sprain of ligaments of lumbar spine, initial encounter: Secondary | ICD-10-CM | POA: Diagnosis not present

## 2022-07-10 DIAGNOSIS — M9901 Segmental and somatic dysfunction of cervical region: Secondary | ICD-10-CM | POA: Diagnosis not present

## 2022-07-10 DIAGNOSIS — S161XXA Strain of muscle, fascia and tendon at neck level, initial encounter: Secondary | ICD-10-CM | POA: Diagnosis not present

## 2022-07-10 DIAGNOSIS — S233XXA Sprain of ligaments of thoracic spine, initial encounter: Secondary | ICD-10-CM | POA: Diagnosis not present

## 2022-07-11 DIAGNOSIS — F4323 Adjustment disorder with mixed anxiety and depressed mood: Secondary | ICD-10-CM | POA: Diagnosis not present

## 2022-07-12 DIAGNOSIS — S335XXA Sprain of ligaments of lumbar spine, initial encounter: Secondary | ICD-10-CM | POA: Diagnosis not present

## 2022-07-12 DIAGNOSIS — S161XXA Strain of muscle, fascia and tendon at neck level, initial encounter: Secondary | ICD-10-CM | POA: Diagnosis not present

## 2022-07-12 DIAGNOSIS — M9901 Segmental and somatic dysfunction of cervical region: Secondary | ICD-10-CM | POA: Diagnosis not present

## 2022-07-12 DIAGNOSIS — S233XXA Sprain of ligaments of thoracic spine, initial encounter: Secondary | ICD-10-CM | POA: Diagnosis not present

## 2022-07-17 DIAGNOSIS — M9901 Segmental and somatic dysfunction of cervical region: Secondary | ICD-10-CM | POA: Diagnosis not present

## 2022-07-17 DIAGNOSIS — S335XXA Sprain of ligaments of lumbar spine, initial encounter: Secondary | ICD-10-CM | POA: Diagnosis not present

## 2022-07-17 DIAGNOSIS — S161XXA Strain of muscle, fascia and tendon at neck level, initial encounter: Secondary | ICD-10-CM | POA: Diagnosis not present

## 2022-07-17 DIAGNOSIS — S233XXA Sprain of ligaments of thoracic spine, initial encounter: Secondary | ICD-10-CM | POA: Diagnosis not present

## 2022-07-19 DIAGNOSIS — S233XXA Sprain of ligaments of thoracic spine, initial encounter: Secondary | ICD-10-CM | POA: Diagnosis not present

## 2022-07-19 DIAGNOSIS — M9901 Segmental and somatic dysfunction of cervical region: Secondary | ICD-10-CM | POA: Diagnosis not present

## 2022-07-19 DIAGNOSIS — S161XXA Strain of muscle, fascia and tendon at neck level, initial encounter: Secondary | ICD-10-CM | POA: Diagnosis not present

## 2022-07-19 DIAGNOSIS — S335XXA Sprain of ligaments of lumbar spine, initial encounter: Secondary | ICD-10-CM | POA: Diagnosis not present

## 2022-07-30 DIAGNOSIS — Z1231 Encounter for screening mammogram for malignant neoplasm of breast: Secondary | ICD-10-CM | POA: Diagnosis not present

## 2022-07-31 DIAGNOSIS — M9901 Segmental and somatic dysfunction of cervical region: Secondary | ICD-10-CM | POA: Diagnosis not present

## 2022-07-31 DIAGNOSIS — S335XXA Sprain of ligaments of lumbar spine, initial encounter: Secondary | ICD-10-CM | POA: Diagnosis not present

## 2022-07-31 DIAGNOSIS — S233XXA Sprain of ligaments of thoracic spine, initial encounter: Secondary | ICD-10-CM | POA: Diagnosis not present

## 2022-07-31 DIAGNOSIS — S161XXA Strain of muscle, fascia and tendon at neck level, initial encounter: Secondary | ICD-10-CM | POA: Diagnosis not present

## 2022-08-02 DIAGNOSIS — N6452 Nipple discharge: Secondary | ICD-10-CM | POA: Diagnosis not present

## 2022-08-14 DIAGNOSIS — F4323 Adjustment disorder with mixed anxiety and depressed mood: Secondary | ICD-10-CM | POA: Diagnosis not present

## 2022-08-20 DIAGNOSIS — S161XXA Strain of muscle, fascia and tendon at neck level, initial encounter: Secondary | ICD-10-CM | POA: Diagnosis not present

## 2022-08-20 DIAGNOSIS — S335XXA Sprain of ligaments of lumbar spine, initial encounter: Secondary | ICD-10-CM | POA: Diagnosis not present

## 2022-08-20 DIAGNOSIS — M9901 Segmental and somatic dysfunction of cervical region: Secondary | ICD-10-CM | POA: Diagnosis not present

## 2022-08-20 DIAGNOSIS — S233XXA Sprain of ligaments of thoracic spine, initial encounter: Secondary | ICD-10-CM | POA: Diagnosis not present

## 2022-08-27 DIAGNOSIS — S161XXA Strain of muscle, fascia and tendon at neck level, initial encounter: Secondary | ICD-10-CM | POA: Diagnosis not present

## 2022-08-27 DIAGNOSIS — M9901 Segmental and somatic dysfunction of cervical region: Secondary | ICD-10-CM | POA: Diagnosis not present

## 2022-08-27 DIAGNOSIS — S233XXA Sprain of ligaments of thoracic spine, initial encounter: Secondary | ICD-10-CM | POA: Diagnosis not present

## 2022-08-27 DIAGNOSIS — S335XXA Sprain of ligaments of lumbar spine, initial encounter: Secondary | ICD-10-CM | POA: Diagnosis not present

## 2022-09-03 DIAGNOSIS — S335XXA Sprain of ligaments of lumbar spine, initial encounter: Secondary | ICD-10-CM | POA: Diagnosis not present

## 2022-09-03 DIAGNOSIS — S161XXA Strain of muscle, fascia and tendon at neck level, initial encounter: Secondary | ICD-10-CM | POA: Diagnosis not present

## 2022-09-03 DIAGNOSIS — M9901 Segmental and somatic dysfunction of cervical region: Secondary | ICD-10-CM | POA: Diagnosis not present

## 2022-09-03 DIAGNOSIS — S233XXA Sprain of ligaments of thoracic spine, initial encounter: Secondary | ICD-10-CM | POA: Diagnosis not present

## 2022-09-10 ENCOUNTER — Ambulatory Visit: Payer: BC Managed Care – PPO | Attending: Internal Medicine | Admitting: Internal Medicine

## 2022-09-10 ENCOUNTER — Encounter: Payer: Self-pay | Admitting: Internal Medicine

## 2022-09-10 VITALS — BP 97/67 | HR 81 | Resp 12 | Ht 62.0 in | Wt 125.0 lb

## 2022-09-10 DIAGNOSIS — M79641 Pain in right hand: Secondary | ICD-10-CM | POA: Diagnosis not present

## 2022-09-10 DIAGNOSIS — S161XXA Strain of muscle, fascia and tendon at neck level, initial encounter: Secondary | ICD-10-CM | POA: Diagnosis not present

## 2022-09-10 DIAGNOSIS — M9901 Segmental and somatic dysfunction of cervical region: Secondary | ICD-10-CM | POA: Diagnosis not present

## 2022-09-10 DIAGNOSIS — S233XXA Sprain of ligaments of thoracic spine, initial encounter: Secondary | ICD-10-CM | POA: Diagnosis not present

## 2022-09-10 DIAGNOSIS — R768 Other specified abnormal immunological findings in serum: Secondary | ICD-10-CM

## 2022-09-10 DIAGNOSIS — R599 Enlarged lymph nodes, unspecified: Secondary | ICD-10-CM | POA: Diagnosis not present

## 2022-09-10 DIAGNOSIS — M79642 Pain in left hand: Secondary | ICD-10-CM | POA: Diagnosis not present

## 2022-09-10 DIAGNOSIS — S335XXA Sprain of ligaments of lumbar spine, initial encounter: Secondary | ICD-10-CM | POA: Diagnosis not present

## 2022-09-10 NOTE — Progress Notes (Signed)
Office Visit Note  Patient: Brittany Mejia             Date of Birth: [birth year]           MRN: [id number]             PCP: Lockie Mola, MD Referring: Doreene Eland, MD Visit Date: 09/10/2022 Occupation: Dental hygienist  Subjective:  New Patient (Initial Visit) (Patient states she sometimes has pain in her arms and feet.)   History of Present Illness: Brittany Mejia is a 42 y.o. female here for evaluation of positive ANA associated with cervical lymph node swelling and joint pains. She started to notice swelling or nodule since around February or March of this year. She does not recall previous issues with this. Not associated with preceding illness. She did not have any new tooth or gum problem. This was mildly tender. She has some joint pain affecting both hands intermittently. Without any visible swelling or discoloration. She has not noticed new skin rashes. No photosensitive rash no raynaud's or nail changes. She does have fatigue and notices some generalized hair thinning no bald spots or scalp changes. Denies history of abnormal bleeding or blood clots.    Activities of Daily Living:  Patient reports morning stiffness for 0 minute.   Patient Denies nocturnal pain.  Difficulty dressing/grooming: Denies Difficulty climbing stairs: Denies Difficulty getting out of chair: Denies Difficulty using hands for taps, buttons, cutlery, and/or writing: Denies  Review of Systems  Constitutional:  Positive for fatigue.  HENT:  Negative for mouth sores and mouth dryness.   Eyes:  Positive for dryness.  Respiratory:  Negative for shortness of breath.   Cardiovascular:  Positive for palpitations. Negative for chest pain.  Gastrointestinal:  Negative for blood in stool, constipation and diarrhea.  Endocrine: Negative for increased urination.  Genitourinary:  Negative for involuntary urination.  Musculoskeletal:  Positive for joint pain, joint pain, myalgias, muscle  tenderness and myalgias. Negative for gait problem, joint swelling, muscle weakness and morning stiffness.  Skin:  Positive for hair loss. Negative for color change, rash and sensitivity to sunlight.  Allergic/Immunologic: Negative for susceptible to infections.  Neurological:  Negative for dizziness and headaches.  Hematological:  Positive for swollen glands.  Psychiatric/Behavioral:  Positive for depressed mood. Negative for sleep disturbance. The patient is not nervous/anxious.     PMFS History:  Patient Active Problem List   Diagnosis Date Noted   Positive ANA (antinuclear antibody) 09/10/2022   Bilateral hand pain 09/10/2022   Enlarged lymph node 04/26/2022   Left breast mass 06/10/2013    Past Medical History:  Diagnosis Date   Headache     Family History  Problem Relation Age of Onset   Diabetes Mother    Hypertension Mother    Past Surgical History:  Procedure Laterality Date   APPENDECTOMY     Performed in [place] at age 65   Social History   Social History Narrative   Not on file   Immunization History  Administered Date(s) Administered   Tdap 04/03/2013     Objective: Vital Signs: BP 97/67 (BP Location: Right Arm, Patient Position: Sitting, Cuff Size: Normal)   Pulse 81   Resp 12   Ht 5\' 2"  (1.575 m)   Wt 125 lb (56.7 kg)   LMP 08/22/2022   Breastfeeding No   BMI 22.86 kg/m    Physical Exam HENT:     Mouth/Throat:     Mouth: Mucous membranes are moist.  Pharynx: Oropharynx is clear.  Eyes:     Conjunctiva/sclera: Conjunctivae normal.  Neck:     Comments: Palpable nodule submandibular <1cm diameter, nontender Cardiovascular:     Rate and Rhythm: Normal rate and regular rhythm.  Skin:    Findings: No rash.  Neurological:     Mental Status: She is alert.      Musculoskeletal Exam:  Neck full ROM no tenderness Shoulders full ROM no tenderness or swelling Elbows full ROM no tenderness or swelling Wrists full ROM no tenderness or  swelling Fingers full ROM no tenderness or swelling Knees full ROM no tenderness or swelling Ankles full ROM no tenderness or swelling   Investigation: No additional findings.  Imaging: No results found.  Recent Labs: Lab Results  Component Value Date   WBC 4.5 04/26/2022   HGB 12.1 04/26/2022   PLT 270 04/26/2022   NA 139 04/26/2022   K 3.6 04/26/2022   CL 102 04/26/2022   CO2 23 04/26/2022   GLUCOSE 115 (H) 04/26/2022   BUN 14 04/26/2022   CREATININE 0.70 04/26/2022   BILITOT 0.3 04/26/2022   ALKPHOS 66 04/26/2022   AST 14 04/26/2022   ALT 13 04/26/2022   PROT 7.4 04/26/2022   ALBUMIN 4.4 04/26/2022   CALCIUM 9.2 04/26/2022    Speciality Comments: No specialty comments available.  Procedures:  No procedures performed Allergies: Patient has no known allergies.   Assessment / Plan:     Visit Diagnoses: Positive ANA (antinuclear antibody) - Plan: RNP Antibody, Anti-Smith antibody, Sjogrens syndrome-A extractable nuclear antibody, Sjogrens syndrome-B extractable nuclear antibody, Anti-DNA antibody, double-stranded, C3 and C4  Positive ANA but no specific clinical criteria findings on exam or in history today. Checking extractable nuclear antibody panel also serum complements. Primary question to rule out could be early sjogrens. But lower pretest suspicion.  Enlarged lymph node  Appears most consistent with submandibular lymph node swelling. Currently small and nontender. Reportedly has been larger previously I recommend she would only need investigation with ultrasound or possible biopsy if becoming more symptomatic or progressive enlargement >1cm.  Bilateral hand pain  No synovitis on exam today  also no obvious structural abnormality. Possibly use related or very early osteoarthritis. Conservative treatment with NSAIDs or supplements PRN.  Orders: Orders Placed This Encounter  Procedures   RNP Antibody   Anti-Smith antibody   Sjogrens syndrome-A extractable  nuclear antibody   Sjogrens syndrome-B extractable nuclear antibody   Anti-DNA antibody, double-stranded   C3 and C4   No orders of the defined types were placed in this encounter.    Follow-Up Instructions: Return if symptoms worsen or fail to improve.   Fuller Plan, MD  Note - This record has been created using [employer].  Chart creation errors have been sought, but may not always  have been located. Such creation errors do not reflect on  the standard of medical care.

## 2022-09-11 DIAGNOSIS — F4323 Adjustment disorder with mixed anxiety and depressed mood: Secondary | ICD-10-CM | POA: Diagnosis not present

## 2022-09-11 LAB — SJOGRENS SYNDROME-B EXTRACTABLE NUCLEAR ANTIBODY: SSB (La) (ENA) Antibody, IgG: <1 AI

## 2022-09-11 LAB — RNP ANTIBODY: Ribonucleic Protein(ENA) Antibody, IgG: <1 AI

## 2022-09-11 LAB — C3 AND C4
C3 Complement: 144 mg/dL (ref 83–193)
C4 Complement: 29 mg/dL (ref 15–57)

## 2022-09-11 LAB — ANTI-DNA ANTIBODY, DOUBLE-STRANDED: ds DNA Ab: <1 IU/mL

## 2022-09-11 LAB — ANTI-SMITH ANTIBODY: ENA SM Ab Ser-aCnc: <1 AI

## 2022-09-11 LAB — SJOGRENS SYNDROME-A EXTRACTABLE NUCLEAR ANTIBODY: SSA (Ro) (ENA) Antibody, IgG: <1 AI

## 2022-09-17 DIAGNOSIS — S161XXA Strain of muscle, fascia and tendon at neck level, initial encounter: Secondary | ICD-10-CM | POA: Diagnosis not present

## 2022-09-17 DIAGNOSIS — S233XXA Sprain of ligaments of thoracic spine, initial encounter: Secondary | ICD-10-CM | POA: Diagnosis not present

## 2022-09-17 DIAGNOSIS — M9901 Segmental and somatic dysfunction of cervical region: Secondary | ICD-10-CM | POA: Diagnosis not present

## 2022-09-17 DIAGNOSIS — S335XXA Sprain of ligaments of lumbar spine, initial encounter: Secondary | ICD-10-CM | POA: Diagnosis not present

## 2022-09-18 DIAGNOSIS — F4323 Adjustment disorder with mixed anxiety and depressed mood: Secondary | ICD-10-CM | POA: Diagnosis not present

## 2022-09-24 DIAGNOSIS — S161XXA Strain of muscle, fascia and tendon at neck level, initial encounter: Secondary | ICD-10-CM | POA: Diagnosis not present

## 2022-09-24 DIAGNOSIS — S335XXA Sprain of ligaments of lumbar spine, initial encounter: Secondary | ICD-10-CM | POA: Diagnosis not present

## 2022-09-24 DIAGNOSIS — M9901 Segmental and somatic dysfunction of cervical region: Secondary | ICD-10-CM | POA: Diagnosis not present

## 2022-09-24 DIAGNOSIS — S233XXA Sprain of ligaments of thoracic spine, initial encounter: Secondary | ICD-10-CM | POA: Diagnosis not present

## 2022-09-25 DIAGNOSIS — F4323 Adjustment disorder with mixed anxiety and depressed mood: Secondary | ICD-10-CM | POA: Diagnosis not present

## 2022-10-08 DIAGNOSIS — S335XXA Sprain of ligaments of lumbar spine, initial encounter: Secondary | ICD-10-CM | POA: Diagnosis not present

## 2022-10-08 DIAGNOSIS — S233XXA Sprain of ligaments of thoracic spine, initial encounter: Secondary | ICD-10-CM | POA: Diagnosis not present

## 2022-10-08 DIAGNOSIS — S161XXA Strain of muscle, fascia and tendon at neck level, initial encounter: Secondary | ICD-10-CM | POA: Diagnosis not present

## 2022-10-08 DIAGNOSIS — M9901 Segmental and somatic dysfunction of cervical region: Secondary | ICD-10-CM | POA: Diagnosis not present

## 2022-10-09 DIAGNOSIS — F4323 Adjustment disorder with mixed anxiety and depressed mood: Secondary | ICD-10-CM | POA: Diagnosis not present

## 2022-10-16 DIAGNOSIS — F4323 Adjustment disorder with mixed anxiety and depressed mood: Secondary | ICD-10-CM | POA: Diagnosis not present

## 2022-10-24 DIAGNOSIS — S161XXA Strain of muscle, fascia and tendon at neck level, initial encounter: Secondary | ICD-10-CM | POA: Diagnosis not present

## 2022-10-24 DIAGNOSIS — M9901 Segmental and somatic dysfunction of cervical region: Secondary | ICD-10-CM | POA: Diagnosis not present

## 2022-10-24 DIAGNOSIS — S233XXA Sprain of ligaments of thoracic spine, initial encounter: Secondary | ICD-10-CM | POA: Diagnosis not present

## 2022-10-24 DIAGNOSIS — S335XXA Sprain of ligaments of lumbar spine, initial encounter: Secondary | ICD-10-CM | POA: Diagnosis not present

## 2022-11-05 DIAGNOSIS — F4323 Adjustment disorder with mixed anxiety and depressed mood: Secondary | ICD-10-CM | POA: Diagnosis not present

## 2022-11-11 DIAGNOSIS — F4323 Adjustment disorder with mixed anxiety and depressed mood: Secondary | ICD-10-CM | POA: Diagnosis not present

## 2022-11-18 DIAGNOSIS — F4323 Adjustment disorder with mixed anxiety and depressed mood: Secondary | ICD-10-CM | POA: Diagnosis not present

## 2022-11-27 DIAGNOSIS — F4323 Adjustment disorder with mixed anxiety and depressed mood: Secondary | ICD-10-CM | POA: Diagnosis not present

## 2022-12-10 DIAGNOSIS — F4323 Adjustment disorder with mixed anxiety and depressed mood: Secondary | ICD-10-CM | POA: Diagnosis not present

## 2022-12-17 DIAGNOSIS — F4323 Adjustment disorder with mixed anxiety and depressed mood: Secondary | ICD-10-CM | POA: Diagnosis not present

## 2022-12-25 DIAGNOSIS — F4323 Adjustment disorder with mixed anxiety and depressed mood: Secondary | ICD-10-CM | POA: Diagnosis not present

## 2022-12-31 DIAGNOSIS — F4323 Adjustment disorder with mixed anxiety and depressed mood: Secondary | ICD-10-CM | POA: Diagnosis not present

## 2023-01-07 DIAGNOSIS — F4323 Adjustment disorder with mixed anxiety and depressed mood: Secondary | ICD-10-CM | POA: Diagnosis not present

## 2024-04-09 ENCOUNTER — Encounter: Admitting: Obstetrics and Gynecology
